# Patient Record
Sex: Male | Born: 1960 | Race: White | Hispanic: No | Marital: Married | Smoking: Never smoker
Health system: Southern US, Community
[De-identification: ages and names within clinical notes are randomized; demographics above are authoritative.]

## PROBLEM LIST (undated history)

## (undated) DIAGNOSIS — H409 Unspecified glaucoma: Secondary | ICD-10-CM

## (undated) DIAGNOSIS — E291 Testicular hypofunction: Secondary | ICD-10-CM

## (undated) DIAGNOSIS — E559 Vitamin D deficiency, unspecified: Secondary | ICD-10-CM

## (undated) DIAGNOSIS — I10 Essential (primary) hypertension: Secondary | ICD-10-CM

## (undated) DIAGNOSIS — E785 Hyperlipidemia, unspecified: Secondary | ICD-10-CM

## (undated) HISTORY — DX: Vitamin D deficiency, unspecified: E55.9

## (undated) HISTORY — PX: FINGER SURGERY: SHX640

## (undated) HISTORY — PX: WISDOM TOOTH EXTRACTION: SHX21

## (undated) HISTORY — DX: Testicular hypofunction: E29.1

## (undated) HISTORY — DX: Unspecified glaucoma: H40.9

---

## 2001-08-12 ENCOUNTER — Ambulatory Visit (HOSPITAL_COMMUNITY): Admission: RE | Admit: 2001-08-12 | Discharge: 2001-08-12 | Payer: Self-pay | Admitting: Internal Medicine

## 2001-08-12 ENCOUNTER — Encounter: Payer: Self-pay | Admitting: Internal Medicine

## 2001-08-15 ENCOUNTER — Emergency Department (HOSPITAL_COMMUNITY): Admission: EM | Admit: 2001-08-15 | Discharge: 2001-08-15 | Payer: Self-pay | Admitting: Emergency Medicine

## 2001-08-15 ENCOUNTER — Encounter: Payer: Self-pay | Admitting: Emergency Medicine

## 2004-10-07 ENCOUNTER — Emergency Department (HOSPITAL_COMMUNITY): Admission: EM | Admit: 2004-10-07 | Discharge: 2004-10-07 | Payer: Self-pay | Admitting: Emergency Medicine

## 2012-03-26 ENCOUNTER — Encounter (HOSPITAL_BASED_OUTPATIENT_CLINIC_OR_DEPARTMENT_OTHER): Payer: Self-pay | Admitting: *Deleted

## 2012-03-26 ENCOUNTER — Other Ambulatory Visit: Payer: Self-pay | Admitting: Orthopedic Surgery

## 2012-03-26 LAB — BASIC METABOLIC PANEL
BUN: 19 mg/dL (ref 6–23)
Calcium: 9.5 mg/dL (ref 8.4–10.5)
Chloride: 107 mEq/L (ref 96–112)
Creatinine, Ser: 1.03 mg/dL (ref 0.50–1.35)
GFR calc Af Amer: >90 mL/min (ref >90)

## 2012-03-26 NOTE — Progress Notes (Signed)
Called dr Oneta Rack for ekg and notes-bmet done

## 2012-03-26 NOTE — H&P (Signed)
  Jared Carr is an 51 y.o. male.   Chief Complaint: c/o injury to the left small finger. HPI: Patient is a 51 y/o male who sustained an injury to his left small finger when he was leading a horse on a rope and the rope became wrapped around his left small finger. This injury occurred on 03/13/12. He sought no medical treatment until when he presented to our office for evaluation.  Past Medical History  Diagnosis Date  . Hypertension   . Hyperlipemia     Past Surgical History  Procedure Date  . Wisdom tooth extraction     No family history on file. Social History:  reports that he has never smoked. He does not have any smokeless tobacco history on file. He reports that he drinks alcohol. His drug history not on file.  Allergies: No Known Allergies  No prescriptions prior to admission    No results found for this or any previous visit (from the past 48 hour(s)).  No results found.   Pertinent items are noted in HPI.  There were no vitals taken for this visit.  General appearance: alert Head: Normocephalic, without obvious abnormality Neck: supple, symmetrical, trachea midline Resp: clear to auscultation bilaterally Cardio: regular rate and rhythm GI: normal findings: bowel sounds normal Extremities:Exam of the left small finger reveals 2+ swelling with abnormal rotation. Neurovascularly intact.X-rays reveal long spiral oblique fracture of the P-1 segment with displacement. Pulses: 2+ and symmetric Skin: normal Neurologic: Grossly normal    Assessment/Plan Impression: Long spiral oblique fracture of the proximal phalanx left small finger.  Plan: Patient to be taken to the OR for ORIF of the left small finger P-1 segment. The procedure, risks and post-op course were discussed with the patient at length and he was in agreement with the plan.  DASNOIT,Ethelyne Erich J 03/26/2012, 4:50 PM    H&P documentation: 03/27/2012  -History and Physical Reviewed  -Patient has been  re-examined  -No change in the plan of care  Wyn Forster, MD

## 2012-03-27 ENCOUNTER — Encounter (HOSPITAL_BASED_OUTPATIENT_CLINIC_OR_DEPARTMENT_OTHER): Payer: Self-pay | Admitting: Anesthesiology

## 2012-03-27 ENCOUNTER — Encounter (HOSPITAL_BASED_OUTPATIENT_CLINIC_OR_DEPARTMENT_OTHER)
Admission: RE | Disposition: A | Discharge status: Home / Self Care | Payer: Self-pay | Source: Ambulatory Visit | Attending: Orthopedic Surgery

## 2012-03-27 ENCOUNTER — Encounter (HOSPITAL_BASED_OUTPATIENT_CLINIC_OR_DEPARTMENT_OTHER): Payer: Self-pay | Admitting: *Deleted

## 2012-03-27 ENCOUNTER — Ambulatory Visit (HOSPITAL_BASED_OUTPATIENT_CLINIC_OR_DEPARTMENT_OTHER): Payer: BC Managed Care – PPO | Admitting: Anesthesiology

## 2012-03-27 ENCOUNTER — Encounter (HOSPITAL_BASED_OUTPATIENT_CLINIC_OR_DEPARTMENT_OTHER): Payer: Self-pay | Admitting: Certified Registered"

## 2012-03-27 ENCOUNTER — Ambulatory Visit (HOSPITAL_BASED_OUTPATIENT_CLINIC_OR_DEPARTMENT_OTHER)
Admission: RE | Admit: 2012-03-27 | Discharge: 2012-03-27 | Disposition: A | Discharge status: Home / Self Care | Payer: BC Managed Care – PPO | Source: Ambulatory Visit | Attending: Orthopedic Surgery | Admitting: Orthopedic Surgery

## 2012-03-27 DIAGNOSIS — E785 Hyperlipidemia, unspecified: Secondary | ICD-10-CM | POA: Insufficient documentation

## 2012-03-27 DIAGNOSIS — I10 Essential (primary) hypertension: Secondary | ICD-10-CM | POA: Insufficient documentation

## 2012-03-27 DIAGNOSIS — IMO0002 Reserved for concepts with insufficient information to code with codable children: Secondary | ICD-10-CM | POA: Insufficient documentation

## 2012-03-27 DIAGNOSIS — Y9352 Activity, horseback riding: Secondary | ICD-10-CM | POA: Insufficient documentation

## 2012-03-27 DIAGNOSIS — X58XXXA Exposure to other specified factors, initial encounter: Secondary | ICD-10-CM | POA: Insufficient documentation

## 2012-03-27 HISTORY — DX: Essential (primary) hypertension: I10

## 2012-03-27 HISTORY — PX: ORIF FINGER FRACTURE: SHX2122

## 2012-03-27 HISTORY — DX: Hyperlipidemia, unspecified: E78.5

## 2012-03-27 SURGERY — OPEN REDUCTION INTERNAL FIXATION (ORIF) METACARPAL (FINGER) FRACTURE
Anesthesia: General | Site: Finger | Laterality: Left | Wound class: Clean

## 2012-03-27 MED ORDER — LIDOCAINE HCL 1 % IJ SOLN
INTRAMUSCULAR | Status: DC | PRN
  Administered 2012-03-27: 2 mL via INTRADERMAL

## 2012-03-27 MED ORDER — CHLORHEXIDINE GLUCONATE 4 % EX LIQD: 60.0000 mL | Freq: Once | CUTANEOUS | Status: DC

## 2012-03-27 MED ORDER — ACETAMINOPHEN 10 MG/ML IV SOLN: 1000.0000 mg | Freq: Once | INTRAVENOUS | Status: DC

## 2012-03-27 MED ORDER — ATROPINE SULFATE 0.4 MG/ML IJ SOLN: 0.4000 mg | Freq: Once | INTRAMUSCULAR | Status: DC | PRN

## 2012-03-27 MED ORDER — LIDOCAINE HCL (CARDIAC) 20 MG/ML IV SOLN
INTRAVENOUS | Status: DC | PRN
  Administered 2012-03-27: 40 mg via INTRAVENOUS

## 2012-03-27 MED ORDER — FENTANYL CITRATE 0.05 MG/ML IJ SOLN
50.0000 ug | INTRAMUSCULAR | Status: DC | PRN
  Administered 2012-03-27: 100 ug via INTRAVENOUS

## 2012-03-27 MED ORDER — FENTANYL CITRATE 0.05 MG/ML IJ SOLN
INTRAMUSCULAR | Status: DC | PRN
  Administered 2012-03-27: 25 ug via INTRAVENOUS

## 2012-03-27 MED ORDER — MIDAZOLAM HCL 2 MG/2ML IJ SOLN
1.0000 mg | INTRAMUSCULAR | Status: DC | PRN
  Administered 2012-03-27: 2 mg via INTRAVENOUS

## 2012-03-27 MED ORDER — CEFAZOLIN SODIUM-DEXTROSE 2-3 GM-% IV SOLR
2.0000 g | Freq: Once | INTRAVENOUS | Status: AC
  Administered 2012-03-27: 2 g via INTRAVENOUS

## 2012-03-27 MED ORDER — DEXAMETHASONE SODIUM PHOSPHATE 4 MG/ML IJ SOLN
INTRAMUSCULAR | Status: DC | PRN
  Administered 2012-03-27: 4 mg via INTRAVENOUS

## 2012-03-27 MED ORDER — OXYCODONE-ACETAMINOPHEN 5-325 MG PO TABS: ORAL_TABLET | ORAL | Status: DC

## 2012-03-27 MED ORDER — LACTATED RINGERS IV SOLN
INTRAVENOUS | Status: DC
  Administered 2012-03-27 (×2): via INTRAVENOUS

## 2012-03-27 MED ORDER — IBUPROFEN 600 MG PO TABS: 600.0000 mg | ORAL_TABLET | Freq: Four times a day (QID) | ORAL | Status: AC | PRN

## 2012-03-27 MED ORDER — CEPHALEXIN 500 MG PO CAPS: 500.0000 mg | ORAL_CAPSULE | Freq: Three times a day (TID) | ORAL | Status: AC

## 2012-03-27 MED ORDER — ONDANSETRON HCL 4 MG/2ML IJ SOLN
INTRAMUSCULAR | Status: DC | PRN
  Administered 2012-03-27: 4 mg via INTRAVENOUS

## 2012-03-27 MED ORDER — PROPOFOL 10 MG/ML IV EMUL
INTRAVENOUS | Status: DC | PRN
  Administered 2012-03-27: 250 mg via INTRAVENOUS

## 2012-03-27 MED ORDER — ROPIVACAINE HCL 5 MG/ML IJ SOLN
INTRAMUSCULAR | Status: DC | PRN
  Administered 2012-03-27: 25 mL

## 2012-03-27 SURGICAL SUPPLY — 68 items
BANDAGE ADHESIVE 1X3 (GAUZE/BANDAGES/DRESSINGS) IMPLANT
BANDAGE ELASTIC 3 VELCRO ST LF (GAUZE/BANDAGES/DRESSINGS) ×2 IMPLANT
BANDAGE GAUZE ELAST BULKY 4 IN (GAUZE/BANDAGES/DRESSINGS) IMPLANT
BIT DRILL 1.0 (BIT) ×4
BIT DRILL 1.0X50 (BIT) IMPLANT
BIT DRILL 1.1 MINI (BIT) IMPLANT
BLADE MINI RND TIP GREEN BEAV (BLADE) IMPLANT
BLADE SURG 15 STRL LF DISP TIS (BLADE) ×1 IMPLANT
BLADE SURG 15 STRL SS (BLADE) ×2
BNDG CMPR 9X4 STRL LF SNTH (GAUZE/BANDAGES/DRESSINGS) ×1
BNDG CMPR MD 5X2 ELC HKLP STRL (GAUZE/BANDAGES/DRESSINGS) ×1
BNDG COHESIVE 1X5 TAN STRL LF (GAUZE/BANDAGES/DRESSINGS) IMPLANT
BNDG ELASTIC 2 VLCR STRL LF (GAUZE/BANDAGES/DRESSINGS) ×1 IMPLANT
BNDG ESMARK 4X9 LF (GAUZE/BANDAGES/DRESSINGS) ×1 IMPLANT
BRUSH SCRUB EZ PLAIN DRY (MISCELLANEOUS) ×2 IMPLANT
CANISTER SUCTION 1200CC (MISCELLANEOUS) IMPLANT
CLOTH BEACON ORANGE TIMEOUT ST (SAFETY) ×2 IMPLANT
CORDS BIPOLAR (ELECTRODE) ×2 IMPLANT
COVER MAYO STAND STRL (DRAPES) ×2 IMPLANT
COVER TABLE BACK 60X90 (DRAPES) ×2 IMPLANT
CUFF TOURNIQUET SINGLE 18IN (TOURNIQUET CUFF) ×1 IMPLANT
DECANTER SPIKE VIAL GLASS SM (MISCELLANEOUS) IMPLANT
DRAPE OEC MINIVIEW 54X84 (DRAPES) ×2 IMPLANT
DRAPE SURG 17X23 STRL (DRAPES) ×2 IMPLANT
DRILL BIT 1.1 MINI (BIT) ×2
GLOVE BIOGEL M STRL SZ7.5 (GLOVE) ×3 IMPLANT
GLOVE BIOGEL PI IND STRL 8 (GLOVE) IMPLANT
GLOVE BIOGEL PI INDICATOR 8 (GLOVE) ×1
GLOVE ORTHO TXT STRL SZ7.5 (GLOVE) ×2 IMPLANT
GOWN PREVENTION PLUS XLARGE (GOWN DISPOSABLE) ×1 IMPLANT
GOWN PREVENTION PLUS XXLARGE (GOWN DISPOSABLE) ×1 IMPLANT
GOWN STRL REIN XL XLG (GOWN DISPOSABLE) ×4 IMPLANT
NEEDLE 27GAX1X1/2 (NEEDLE) IMPLANT
NS IRRIG 1000ML POUR BTL (IV SOLUTION) ×2 IMPLANT
PACK BASIN DAY SURGERY FS (CUSTOM PROCEDURE TRAY) ×2 IMPLANT
PAD CAST 3X4 CTTN HI CHSV (CAST SUPPLIES) ×1 IMPLANT
PAD CAST 4YDX4 CTTN HI CHSV (CAST SUPPLIES) IMPLANT
PADDING CAST ABS 4INX4YD NS (CAST SUPPLIES)
PADDING CAST ABS COTTON 4X4 ST (CAST SUPPLIES) ×1 IMPLANT
PADDING CAST COTTON 3X4 STRL (CAST SUPPLIES) ×2
PADDING CAST COTTON 4X4 STRL (CAST SUPPLIES)
PADDING UNDERCAST 2  STERILE (CAST SUPPLIES) IMPLANT
PLATE H EXTENDED 1.3 RIGHT (Plate) ×1 IMPLANT
SCREW SELF TAP CORTEX 1.3 6MM (Screw) ×2 IMPLANT
SCREW SELF TAP CORTEX 1.3 7MM (Screw) ×2 IMPLANT
SCREW SELF TAP CORTEX 1.3 8MM (Screw) ×3 IMPLANT
SCREW SELF TAP CORTEX 1.3 9MM (Screw) ×1 IMPLANT
SLEEVE SCD COMPRESS KNEE MED (MISCELLANEOUS) ×1 IMPLANT
SPLINT PLASTER CAST XFAST 3X15 (CAST SUPPLIES) ×1 IMPLANT
SPLINT PLASTER XTRA FASTSET 3X (CAST SUPPLIES) ×14
SPONGE GAUZE 4X4 12PLY (GAUZE/BANDAGES/DRESSINGS) ×2 IMPLANT
STOCKINETTE 4X48 STRL (DRAPES) ×2 IMPLANT
STRIP CLOSURE SKIN 1/2X4 (GAUZE/BANDAGES/DRESSINGS) ×1 IMPLANT
SUCTION FRAZIER TIP 10 FR DISP (SUCTIONS) IMPLANT
SUT ETHILON 4 0 PS 2 18 (SUTURE) ×1 IMPLANT
SUT MERSILENE 4 0 P 3 (SUTURE) ×2 IMPLANT
SUT PROLENE 3 0 PS 2 (SUTURE) ×2 IMPLANT
SUT PROLENE 4 0 P 3 18 (SUTURE) ×1 IMPLANT
SUT VIC AB 4-0 P-3 18XBRD (SUTURE) ×1 IMPLANT
SUT VIC AB 4-0 P3 18 (SUTURE) ×2
SYR 3ML 23GX1 SAFETY (SYRINGE) IMPLANT
SYR BULB 3OZ (MISCELLANEOUS) ×2 IMPLANT
SYR CONTROL 10ML LL (SYRINGE) IMPLANT
TOWEL OR 17X24 6PK STRL BLUE (TOWEL DISPOSABLE) ×2 IMPLANT
TRAY DSU PREP LF (CUSTOM PROCEDURE TRAY) ×2 IMPLANT
TUBE CONNECTING 20X1/4 (TUBING) IMPLANT
UNDERPAD 30X30 INCONTINENT (UNDERPADS AND DIAPERS) ×2 IMPLANT
WATER STERILE IRR 1000ML POUR (IV SOLUTION) IMPLANT

## 2012-03-27 NOTE — Anesthesia Preprocedure Evaluation (Signed)
Anesthesia Evaluation  Patient identified by MRN, date of birth, ID band Patient awake    Reviewed: Allergy & Precautions, H&P , NPO status , Patient's Chart, lab work & pertinent test results, reviewed documented beta blocker date and time   Airway Mallampati: II TM Distance: >3 FB Neck ROM: full    Dental   Pulmonary neg pulmonary ROS,          Cardiovascular negative cardio ROS      Neuro/Psych negative neurological ROS  negative psych ROS   GI/Hepatic negative GI ROS, Neg liver ROS,   Endo/Other  negative endocrine ROS  Renal/GU negative Renal ROS  negative genitourinary   Musculoskeletal   Abdominal   Peds  Hematology negative hematology ROS (+)   Anesthesia Other Findings See surgeon's H&P   Reproductive/Obstetrics negative OB ROS                           Anesthesia Physical Anesthesia Plan  ASA: I  Anesthesia Plan: General   Post-op Pain Management:    Induction: Intravenous  Airway Management Planned: LMA  Additional Equipment:   Intra-op Plan:   Post-operative Plan: Extubation in OR  Informed Consent: I have reviewed the patients History and Physical, chart, labs and discussed the procedure including the risks, benefits and alternatives for the proposed anesthesia with the patient or authorized representative who has indicated his/her understanding and acceptance.   Dental Advisory Given  Plan Discussed with: CRNA and Surgeon  Anesthesia Plan Comments:         Anesthesia Quick Evaluation  

## 2012-03-27 NOTE — Progress Notes (Signed)
EKG waived per Dr. Gelene Mink.

## 2012-03-27 NOTE — Anesthesia Procedure Notes (Addendum)
Anesthesia Regional Block:  Supraclavicular block  Pre-Anesthetic Checklist: ,, timeout performed, Correct Patient, Correct Site, Correct Laterality, Correct Procedure, Correct Position, site marked, Risks and benefits discussed,  Surgical consent,  Pre-op evaluation,  At surgeon's request and post-op pain management  Laterality: Left  Prep: chloraprep       Needles:   Needle Type: Other   (Arrow Echogenic)   Needle Length: 9cm  Needle Gauge: 21    Additional Needles:  Procedures: ultrasound guided Supraclavicular block Narrative:  Start time: 03/27/2012 1:45 PM End time: 03/27/2012 1:53 PM Injection made incrementally with aspirations every 5 mL.  Performed by: Personally  Anesthesiologist: C Frederick  Additional Notes: Ultrasound guidance used to: id relevant anatomy, confirm needle position, local anesthetic spread, avoidance of vascular puncture. Picture saved. No complications. Block performed personally by Janetta Hora. Gelene Mink, MD    Supraclavicular block Procedure Name: LMA Insertion Date/Time: 03/27/2012 3:55 PM Performed by: Verlan Friends Pre-anesthesia Checklist: Patient identified, Emergency Drugs available, Suction available, Patient being monitored and Timeout performed Patient Re-evaluated:Patient Re-evaluated prior to inductionOxygen Delivery Method: Circle System Utilized Preoxygenation: Pre-oxygenation with 100% oxygen Intubation Type: IV induction Ventilation: Mask ventilation without difficulty LMA: LMA inserted LMA Size: 5.0 Number of attempts: 1 Airway Equipment and Method: bite block Placement Confirmation: positive ETCO2 Tube secured with: Tape Dental Injury: Teeth and Oropharynx as per pre-operative assessment

## 2012-03-27 NOTE — Brief Op Note (Signed)
03/27/2012  4:55 PM  PATIENT:  Kenard Heidi Dach  51 y.o. 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  PROCEDURE:  TAKE DOWN AND DELAYED LENGTHENING OSTEOPLASTY OF LEFT SMALL PROXIMAL PHALANX WITH PLACEMENT OF LADDER PLATE AND EIGHT SCREWS  SURGEON:   Wyn Forster., MD   PHYSICIAN ASSISTANT:   ASSISTANTS: Mallory Shirk.A-C   ANESTHESIA:   general  EBL:  Total I/O In: 1800 [I.V.:1800] Out: -   BLOOD ADMINISTERED:none  DRAINS: none   LOCAL MEDICATIONS USED: ROPIVICAINE ARM BLOCK  SPECIMEN:  No Specimen  DISPOSITION OF SPECIMEN:  N/A  COUNTS:  YES  TOURNIQUET:  * Missing tourniquet times found for documented tourniquets in log:  [id number] *  DICTATION: .Other Dictation: Dictation Number [id number]  PLAN OF CARE: Discharge to home after PACU  PATIENT DISPOSITION:  PACU - hemodynamically stable.

## 2012-03-27 NOTE — Progress Notes (Signed)
Assisted Dr. Frederick with left, ultrasound guided, supraclavicular block. Side rails up, monitors on throughout procedure. See vital signs in flow sheet. Tolerated Procedure well. 

## 2012-03-27 NOTE — Anesthesia Postprocedure Evaluation (Signed)
  Anesthesia Post-op Note  Patient: Jared Carr  Procedure(s) Performed: Procedure(s) (LRB): OPEN REDUCTION INTERNAL FIXATION (ORIF) METACARPAL (FINGER) FRACTURE (Left)  Patient Location: PACU  Anesthesia Type: GA combined with regional for post-op pain  Level of Consciousness: awake, alert  and oriented  Airway and Oxygen Therapy: Patient Spontanous Breathing  Post-op Pain: none  Post-op Assessment: Post-op Vital signs reviewed  Post-op Vital Signs: Reviewed  Complications: No apparent anesthesia complications

## 2012-03-27 NOTE — Transfer of Care (Signed)
Immediate Anesthesia Transfer of Care Note  Patient: Jared Carr  Procedure(s) Performed: Procedure(s) (LRB): OPEN REDUCTION INTERNAL FIXATION (ORIF) METACARPAL (FINGER) FRACTURE (Left)  Patient Location: PACU  Anesthesia Type: GA combined with regional for post-op pain  Level of Consciousness: awake, alert , oriented and patient cooperative  Airway & Oxygen Therapy: Patient Spontanous Breathing and Patient connected to face mask oxygen  Post-op Assessment: Report given to PACU RN and Post -op Vital signs reviewed and stable  Post vital signs: Reviewed and stable  Complications: No apparent anesthesia complications

## 2012-03-27 NOTE — Op Note (Signed)
[id number] 

## 2012-03-28 ENCOUNTER — Encounter (HOSPITAL_BASED_OUTPATIENT_CLINIC_OR_DEPARTMENT_OTHER): Payer: Self-pay | Admitting: Orthopedic Surgery

## 2012-03-28 NOTE — Op Note (Signed)
NAMECAMEREN, EARNEST NO.:  [id number]  MEDICAL RECORD NO.:  [id number]  LOCATION:                                 FACILITY:  PHYSICIAN:  Katy Fitch. Seaborn Nakama, M.D.      DATE OF BIRTH:  DATE OF PROCEDURE:  03/27/2012 DATE OF DISCHARGE:                              OPERATIVE REPORT   PREOPERATIVE DIAGNOSES:  Fourteen days status post severely comminuted, angulated and shortened fracture of left small finger proximal phalanx with early malunion and considerable callus formation.  POSTOPERATIVE DIAGNOSES:  Fourteen days status post severely comminuted, angulated and shortened fracture of left small finger proximal phalanx with early malunion and considerable callus formation.  OPERATION:  Lengthening osteoplasty with removal of callus and reconstruction of anatomic left small finger proximal phalanx fracture with application of a ladder plate with eight 1.3-mm screws.  OPERATING SURGEON:  Katy Fitch. Courtlynn Holloman, MD  ASSISTANT:  Marveen Reeks Dasnoit, PA-C  ANESTHESIA:  General by LMA supplemented by a left ropivacaine brachial block.  SUPERVISING ANESTHESIOLOGIST:  Janetta Hora. Gelene Mink, MD  INDICATIONS:  Rube Palardy is a 51 year old gentleman referred through the courtesy of Dr. Lucky Cowboy for a chronic painful and deformed left small finger following a significant injury 14 days prior.  He was walking a horse with the lead and had the lead wrapped around his fingers.  The horse bolted and twisted his small finger aggressively. He had a significant pain, swelling, and ecchymosis.  He was in [place] and did not immediately seek healthcare.  He had difficulty with shortening of the finger, extensor lag and inability to flex the finger and noted a rotational deformity.  He was referred by his primary care physician, Dr. Oneta Rack for hand surgery consult on March 26, 2012.  At that time, he was noted to have a malrotated and shortened finger with a significant  extensor lag at the PIP joint and could not flex his finger to within 3 cm of the distal palmar crease.  Four views of the finger demonstrated a comminuted spiral oblique fracture with shortening and apex volar angulation.  We had a detailed informed consent describing all the reasons why anatomic reduction of the fracture is in his best interest.  If the bone remains angulated and shortened, he will always have extensor lag.  He will have tenodesis and inability to flex the finger.  He understands with the late reduction and plate fixation.  He may have tendon adhesion that will cause impaired motion; however, we could perform a late tenolysis and plate removal and should be able to gain significantly improved function with an anatomic reduction of his fracture.  After informed consent, he is brought to the operating room at this time.  PROCEDURE:  Deangelo Groene was brought to room #6 of the [facility] and placed in supine position on the operating table.  Mr. Dimmick experienced a more than 4-hour wait.  Due to multiple difficulties, our surgical team experienced throughout the day due to the Epic electronic medical record.  Indeed, his immediate surgery was delayed for 1 hour and 30 minutes while we attempted to complete the order set for our prior  patient who had have overnight stay.  This is being documented in that if Mr. Southwood is very frustrated at this time with his prolonged wait, but this truly was due to circumstances beyond the control of the surgical team and absolutely related to the Epic medical record system.  Dr. Gelene Mink of Anesthesia provided detailed informed consent.  A perioperative block for comfort was recommended and accepted by Mr. Cope.  Dr. Gelene Mink placed a ropivacaine block without complication leading to excellent anesthesia of the left arm.  Mr. Glazier was then transferred to room #6 where under Dr. Thornton Dales direct supervision,  general anesthesia by LMA technique was induced and 2 g of Ancef were administered as an IV prophylactic antibiotic.  The left arm was prepped with Betadine soap and solution, and sterilely draped.  A sterile C-arm fluoroscope was brought in and this fracture was studied.  With rotational and angular force, the fracture did not move.  Therefore, this was an incipient malunion.  Following routine Betadine scrub and paint of the left upper extremity, we exsanguinated the arm with an Esmarch bandage and inflated an arterial tourniquet of the proximal brachium to 220 mmHg.  Following routine surgical time-out, the procedure commenced with a dorsal incision that curved around the MP extension creases.  The soft tissues were cleared from the dorsum of the extensor mechanism and the extensor was split longitudinally in its midline.  The periosteum had thickened from 1 mm to approximately 4.5 mm with quite a bit of early callus.  We meticulously debrided all the callus preserving as much superficial periosteum as possible.  The fracture involved at least 6 fragments and was quite short and angulated apex volar.  We disassembled the fracture fragments with a dental pick.  Reassembled them as a jigsaw puzzle anatomically and custom bent eight screw 1.3-mm Synthes ladder plate and applied it to the dorsum of the proximal phalanx.  We sequentially placed screws with C-arm fluoroscopic control until a stable construct was achieved.  AP, lateral, and oblique images revealed that the screws were in appropriate length except for perhaps one distal screw that was perhaps 1.5-mm proud.  Unfortunately, we used the shorter screws available in the Synthes set.  The wound was then repaired with a running suture of 4-0 Vicryl, repairing the periosteum as anatomic as possible over the plate.  The extensor was repaired with figure-of-eight sutures of 4-0 Mersilene. The skin was repaired with intradermal 3-0  Prolene and Steri-Strips.  Mr. Caudill was placed in compressive dressing with the ring and small fingers in the safe position.  There were no apparent complications.  We will see him back for follow up in 4 days to initiate active range of motion exercises, carefully buddy strapped to the adjacent ring finger.  For aftercare, he is provided prescriptions for Percocet 5/325 1 p.o. q.4-6 hours p.r.n. pain, 30 tablets without refill; also Motrin 600 mg 1 p.o. q.6 hours p.r.n. pain, 30 tablets with 1 refill; and Keflex 500 mg 1 p.o. q.8 hours x4 days as a prophylactic antibiotic.     Katy Fitch Holleigh Crihfield, M.D.     RVS/MEDQ  D:  03/27/2012  T:  03/28/2012  Job:  [id number]

## 2013-10-04 ENCOUNTER — Encounter: Payer: Self-pay | Admitting: *Deleted

## 2013-10-04 DIAGNOSIS — E559 Vitamin D deficiency, unspecified: Secondary | ICD-10-CM | POA: Insufficient documentation

## 2013-10-04 DIAGNOSIS — E291 Testicular hypofunction: Secondary | ICD-10-CM | POA: Insufficient documentation

## 2013-10-04 DIAGNOSIS — I10 Essential (primary) hypertension: Secondary | ICD-10-CM | POA: Insufficient documentation

## 2013-10-06 ENCOUNTER — Ambulatory Visit: Payer: Self-pay | Admitting: Emergency Medicine

## 2013-10-26 ENCOUNTER — Other Ambulatory Visit: Payer: Self-pay | Admitting: Internal Medicine

## 2014-01-05 ENCOUNTER — Encounter: Payer: Self-pay | Admitting: Internal Medicine

## 2014-01-05 ENCOUNTER — Ambulatory Visit (INDEPENDENT_AMBULATORY_CARE_PROVIDER_SITE_OTHER): Payer: BC Managed Care – PPO | Admitting: Internal Medicine

## 2014-01-05 VITALS — BP 132/84 | HR 60 | Temp 98.8°F | Resp 16 | Ht 69.5 in | Wt 200.0 lb

## 2014-01-05 DIAGNOSIS — E291 Testicular hypofunction: Secondary | ICD-10-CM

## 2014-01-05 DIAGNOSIS — Z79899 Other long term (current) drug therapy: Secondary | ICD-10-CM

## 2014-01-05 DIAGNOSIS — R7309 Other abnormal glucose: Secondary | ICD-10-CM

## 2014-01-05 DIAGNOSIS — E559 Vitamin D deficiency, unspecified: Secondary | ICD-10-CM

## 2014-01-05 DIAGNOSIS — E782 Mixed hyperlipidemia: Secondary | ICD-10-CM | POA: Insufficient documentation

## 2014-01-05 DIAGNOSIS — I10 Essential (primary) hypertension: Secondary | ICD-10-CM

## 2014-01-05 LAB — CBC WITH DIFFERENTIAL/PLATELET
BASOS ABS: 0.1 10*3/uL (ref 0.0–0.1)
Basophils Relative: 1 % (ref 0–1)
Eosinophils Absolute: 0.3 10*3/uL (ref 0.0–0.7)
Eosinophils Relative: 4 % (ref 0–5)
HEMATOCRIT: 41.1 % (ref 39.0–52.0)
HEMOGLOBIN: 14.2 g/dL (ref 13.0–17.0)
LYMPHS PCT: 27 % (ref 12–46)
Lymphs Abs: 2.2 10*3/uL (ref 0.7–4.0)
MCH: 31 pg (ref 26.0–34.0)
MCHC: 34.5 g/dL (ref 30.0–36.0)
MCV: 89.7 fL (ref 78.0–100.0)
MONO ABS: 0.8 10*3/uL (ref 0.1–1.0)
Monocytes Relative: 10 % (ref 3–12)
NEUTROS ABS: 4.7 10*3/uL (ref 1.7–7.7)
Neutrophils Relative %: 58 % (ref 43–77)
Platelets: 289 10*3/uL (ref 150–400)
RBC: 4.58 MIL/uL (ref 4.22–5.81)
RDW: 13.4 % (ref 11.5–15.5)
WBC: 8.1 10*3/uL (ref 4.0–10.5)

## 2014-01-05 NOTE — Patient Instructions (Signed)

## 2014-01-05 NOTE — Progress Notes (Signed)
Patient ID: Jared Carr, male   DOB: [birth year], 53 y.o.   MRN: [id number]    This very nice 53 y.o. male presents for 3 month follow up with Hypertension, Hyperlipidemia, Pre-Diabetes and Vitamin D Deficiency.    HTN predates since   . BP has been controlled at home. Today's BP: 132/84 mmHg . Patient denies any cardiac type chest pain, palpitations, dyspnea/orthopnea/PND, dizziness, claudication, or dependent edema.   Hyperlipidemia is controlled with diet & meds. Last Cholesterol was 149, Triglycerides were 64, HDL 50 and LDL 86 in Oct 2014 - at goal. Patient denies myalgias or other med SE's.    Also, the patient has history of PreDiabetes since 2012 w/A1c of 5.7% and  with last A1c of  6.1% in Oct 2014. Patient denies any symptoms of reactive hypoglycemia, diabetic polys, paresthesias or visual blurring.   Further, Patient has history of Vitamin D Deficiency of 24 in 2008 with last vitamin D of 78 in Oct 2014. Patient supplements vitamin D without any suspected side-effects.  Medication Sig  . aspirin 81 MG tablet Take 81 mg by mouth daily.  Marland Kitchen atenolol (TENORMIN) 100 MG tablet Take 100 mg by mouth at bedtime.  . Cholecalciferol (VITAMIN D3) 5000 UNITS  Take by mouth daily.  . fenofibrate 160 MG tablet Take 160 mg by mouth at bedtime.  Marland Kitchen oxyCODONE-acetaminophen (PERCOCET) 5-325 MG per tablet 1 or 2 tabs every 4 hours as needed for pain  . testosterone cypionate (DEPOTESTOTERONE CYPIONATE) 200  INJECT 1 & 1/2 MILLILITERS EVERY 4 WEEKS    Allergies  Allergen Reactions  . Viagra [Sildenafil Citrate]     palpitations   PMHx:   Past Medical History  Diagnosis Date  . Hyperlipemia   . Hypertension   . Hypogonadism male   . Vitamin D deficiency    FHx:    Reviewed / unchanged  SHx:    Reviewed / unchanged   Systems Review: Constitutional: Denies fever, chills, wt changes, headaches, insomnia, fatigue, night sweats, change in appetite. Eyes: Denies redness, blurred vision,  diplopia, discharge, itchy, watery eyes.  ENT: Denies discharge, congestion, post nasal drip, epistaxis, sore throat, earache, hearing loss, dental pain, tinnitus, vertigo, sinus pain, snoring.  CV: Denies chest pain, palpitations, irregular heartbeat, syncope, dyspnea, diaphoresis, orthopnea, PND, claudication, edema. Respiratory: denies cough, dyspnea, DOE, pleurisy, hoarseness, laryngitis, wheezing.  Gastrointestinal: Denies dysphagia, odynophagia, heartburn, reflux, water brash, abdominal pain or cramps, nausea, vomiting, bloating, diarrhea, constipation, hematemesis, melena, hematochezia,  or hemorrhoids. Genitourinary: Denies dysuria, frequency, urgency, nocturia, hesitancy, discharge, hematuria, flank pain. Musculoskeletal: Denies arthralgias, myalgias, stiffness, jt. swelling, pain, limp, strain/sprain.  Skin: Denies pruritus, rash, hives, warts, acne, eczema, change in skin lesion(s). Neuro: No weakness, tremor, incoordination, spasms, paresthesia, or pain. Psychiatric: Denies confusion, memory loss, or sensory loss. Endo: Denies change in weight, skin, hair change.  Heme/Lymph: No excessive bleeding, bruising, orenlarged lymph nodes.   Exam:  BP 132/84  Pulse 60  Temp(Src) 98.8 F (37.1 C) (Temporal)  Resp 16  Ht 5' 9.5" (1.765 m)  Wt 200 lb (90.719 kg)  BMI 29.12 kg/m2  Appears well nourished - in no distress. Eyes: PERRLA, EOMs, conjunctiva no swelling or erythema. Sinuses: No frontal/maxillary tenderness ENT/Mouth: EAC's clear, TM's nl w/o erythema, bulging. Nares clear w/o erythema, swelling, exudates. Oropharynx clear without erythema or exudates. Oral hygiene is good. Tongue normal, non obstructing. Hearing intact.  Neck: Supple. Thyroid nl. Car 2+/2+ without bruits, nodes or JVD. Chest: Respirations nl with BS clear &  equal w/o rales, rhonchi, wheezing or stridor.  Cor: Heart sounds normal w/ regular rate and rhythm without sig. murmurs, gallops, clicks, or rubs.  Peripheral pulses normal and equal  without edema.  Abdomen: Soft & bowel sounds normal. Non-tender w/o guarding, rebound, hernias, masses, or organomegaly.  Lymphatics: Unremarkable.  Musculoskeletal: Full ROM all peripheral extremities, joint stability, 5/5 strength, and normal gait.  Skin: Warm, dry without exposed rashes, lesions, ecchymosis apparent.  Neuro: Cranial nerves intact, reflexes equal bilaterally. Sensory-motor testing grossly intact. Tendon reflexes grossly intact.  Pysch: Alert & oriented x 3. Insight and judgement nl & appropriate. No ideations.  Assessment and Plan:  1. Hypertension - Continue monitor blood pressure at home. Continue diet/meds same.  2. Hyperlipidemia - Continue diet/meds, exercise,& lifestyle modifications. Continue monitor periodic cholesterol/liver & renal functions   3. Pre-diabetes - Continue diet, exercise, lifestyle modifications. Monitor appropriate labs.  4. Vitamin D Deficiency - Continue supplementation.  Recommended regular exercise, BP monitoring, weight control, and discussed med and SE's. Recommended labs to assess and monitor clinical status. Further disposition pending results of labs.

## 2014-01-06 LAB — LIPID PANEL
CHOL/HDL RATIO: 3.5 ratio
Cholesterol: 173 mg/dL (ref 0–200)
HDL: 49 mg/dL (ref >39)
LDL CALC: 105 mg/dL — AB (ref 0–99)
TRIGLYCERIDES: 95 mg/dL (ref <150)
VLDL: 19 mg/dL (ref 0–40)

## 2014-01-06 LAB — HEMOGLOBIN A1C
Hgb A1c MFr Bld: 6 % — ABNORMAL HIGH (ref <5.7)
Mean Plasma Glucose: 126 mg/dL — ABNORMAL HIGH (ref <117)

## 2014-01-06 LAB — BASIC METABOLIC PANEL WITH GFR
BUN: 23 mg/dL (ref 6–23)
CHLORIDE: 104 meq/L (ref 96–112)
CO2: 27 mEq/L (ref 19–32)
Calcium: 9.4 mg/dL (ref 8.4–10.5)
Creat: 0.9 mg/dL (ref 0.50–1.35)
GFR, Est African American: >89 mL/min
Glucose, Bld: 73 mg/dL (ref 70–99)
POTASSIUM: 4.1 meq/L (ref 3.5–5.3)
Sodium: 140 mEq/L (ref 135–145)

## 2014-01-06 LAB — INSULIN, FASTING: Insulin fasting, serum: 11 u[IU]/mL (ref 3–28)

## 2014-01-06 LAB — HEPATIC FUNCTION PANEL
ALK PHOS: 35 U/L — AB (ref 39–117)
ALT: 23 U/L (ref 0–53)
AST: 19 U/L (ref 0–37)
Albumin: 4.2 g/dL (ref 3.5–5.2)
BILIRUBIN INDIRECT: 0.4 mg/dL (ref 0.2–1.2)
BILIRUBIN TOTAL: 0.5 mg/dL (ref 0.2–1.2)
Bilirubin, Direct: 0.1 mg/dL (ref 0.0–0.3)
TOTAL PROTEIN: 6.8 g/dL (ref 6.0–8.3)

## 2014-01-06 LAB — TSH: TSH: 2.55 u[IU]/mL (ref 0.350–4.500)

## 2014-01-06 LAB — MAGNESIUM: Magnesium: 1.9 mg/dL (ref 1.5–2.5)

## 2014-01-06 LAB — TESTOSTERONE: Testosterone: 781 ng/dL (ref 300–890)

## 2014-01-06 LAB — VITAMIN D 25 HYDROXY (VIT D DEFICIENCY, FRACTURES): Vit D, 25-Hydroxy: 76 ng/mL (ref 30–89)

## 2014-01-08 ENCOUNTER — Other Ambulatory Visit: Payer: Self-pay | Admitting: Emergency Medicine

## 2014-02-01 ENCOUNTER — Other Ambulatory Visit: Payer: Self-pay | Admitting: Internal Medicine

## 2014-04-07 ENCOUNTER — Ambulatory Visit: Payer: Self-pay | Admitting: Emergency Medicine

## 2014-04-19 ENCOUNTER — Encounter: Payer: Self-pay | Admitting: Internal Medicine

## 2014-04-19 ENCOUNTER — Ambulatory Visit (INDEPENDENT_AMBULATORY_CARE_PROVIDER_SITE_OTHER): Payer: BC Managed Care – PPO | Admitting: Internal Medicine

## 2014-04-19 VITALS — BP 118/62 | HR 64 | Temp 99.1°F | Resp 16 | Ht 69.5 in | Wt 198.8 lb

## 2014-04-19 DIAGNOSIS — Z79899 Other long term (current) drug therapy: Secondary | ICD-10-CM

## 2014-04-19 DIAGNOSIS — I10 Essential (primary) hypertension: Secondary | ICD-10-CM

## 2014-04-19 DIAGNOSIS — K2100 Gastro-esophageal reflux disease with esophagitis, without bleeding: Secondary | ICD-10-CM

## 2014-04-19 DIAGNOSIS — E291 Testicular hypofunction: Secondary | ICD-10-CM

## 2014-04-19 DIAGNOSIS — E782 Mixed hyperlipidemia: Secondary | ICD-10-CM

## 2014-04-19 DIAGNOSIS — E559 Vitamin D deficiency, unspecified: Secondary | ICD-10-CM

## 2014-04-19 DIAGNOSIS — R7309 Other abnormal glucose: Secondary | ICD-10-CM

## 2014-04-19 LAB — CBC WITH DIFFERENTIAL/PLATELET
BASOS ABS: 0.1 10*3/uL (ref 0.0–0.1)
Basophils Relative: 1 % (ref 0–1)
Eosinophils Absolute: 0.2 10*3/uL (ref 0.0–0.7)
Eosinophils Relative: 3 % (ref 0–5)
HEMATOCRIT: 42.1 % (ref 39.0–52.0)
HEMOGLOBIN: 14.2 g/dL (ref 13.0–17.0)
LYMPHS PCT: 31 % (ref 12–46)
Lymphs Abs: 2.3 10*3/uL (ref 0.7–4.0)
MCH: 30.7 pg (ref 26.0–34.0)
MCHC: 33.7 g/dL (ref 30.0–36.0)
MCV: 91.1 fL (ref 78.0–100.0)
MONO ABS: 0.8 10*3/uL (ref 0.1–1.0)
MONOS PCT: 10 % (ref 3–12)
NEUTROS ABS: 4.1 10*3/uL (ref 1.7–7.7)
Neutrophils Relative %: 55 % (ref 43–77)
Platelets: 305 10*3/uL (ref 150–400)
RBC: 4.62 MIL/uL (ref 4.22–5.81)
RDW: 13.2 % (ref 11.5–15.5)
WBC: 7.5 10*3/uL (ref 4.0–10.5)

## 2014-04-19 NOTE — Progress Notes (Signed)
Patient ID: Jared Carr, male   DOB: 12-06-1960, 53 y.o.   MRN: [id number]   This very nice 53 y.o.male presents for 3 month follow up with Hypertension, Hyperlipidemia, Pre-Diabetes, testosterone  and Vitamin D Deficiency. Patient relates sense of stamina and improved mood since on Testosterone replacement.   Patient is treated for HTN & BP has been controlled at home. Today's BP: 118/62 mmHg. Patient denies any cardiac type chest pain, palpitations, dyspnea/orthopnea/PND, dizziness, claudication, or dependent edema.   Hyperlipidemia is controlled with diet & meds. Patient denies myalgias or other med SE's. Last Lipids were 01/05/2014: Cholesterol, Total 173; HDL Cholesterol by NMR 49; LDL (calc) 105*; Triglycerides 95   Also, the patient has history of PreDiabetes (A1c 6.0% in Mar 2012) and patient denies any symptoms of reactive hypoglycemia, diabetic polys, paresthesias or visual blurring.  Last A1c was 01/05/2014: Hemoglobin-A1c 6.0*    Further, Patient has history of Vitamin D Deficiency (24 in 2008) and patient supplements vitamin D without any suspected side-effects. Last vitamin D was    Medication List   Take 81 mg by mouth daily.     atenolol 100 MG tablet  TAKE 1 TABLET BY MOUTH DAILY FOR BLOOD PRESSURE     fenofibrate micronized 134 MG capsule  TAKE 1 CAPSULE BY MOUTH AT BEDTIME     Depo-testosterone cypionate 200 MG/ML injection  INJECT 1 AND 1/2 MILLILITERS EVERY 4 WEEKS     Vitamin D3 5000 UNITS Caps  Take by mouth daily.     Allergies  Allergen Reactions  . Viagra [Sildenafil Citrate]     palpitations   PMHx:   Past Medical History  Diagnosis Date  . Hyperlipemia   . Hypertension   . Hypogonadism male   . Vitamin D deficiency    FHx:    Reviewed / unchanged SHx:    Reviewed / unchanged  Systems Review:  Constitutional: Denies fever, chills, wt changes, headaches, insomnia, fatigue, night sweats, change in appetite. Eyes: Denies redness, blurred vision,  diplopia, discharge, itchy, watery eyes.  ENT: Denies discharge, congestion, post nasal drip, epistaxis, sore throat, earache, hearing loss, dental pain, tinnitus, vertigo, sinus pain, snoring.  CV: Denies chest pain, palpitations, irregular heartbeat, syncope, dyspnea, diaphoresis, orthopnea, PND, claudication or edema. Respiratory: denies cough, dyspnea, DOE, pleurisy, hoarseness, laryngitis, wheezing.  Gastrointestinal: Denies dysphagia, odynophagia, heartburn, reflux, water brash, abdominal pain or cramps, nausea, vomiting, bloating, diarrhea, constipation, hematemesis, melena, hematochezia  or hemorrhoids. Genitourinary: Denies dysuria, frequency, urgency, nocturia, hesitancy, discharge, hematuria or flank pain. Musculoskeletal: Denies arthralgias, myalgias, stiffness, jt. swelling, pain, limping or strain/sprain.  Skin: Denies pruritus, rash, hives, warts, acne, eczema or change in skin lesion(s). Neuro: No weakness, tremor, incoordination, spasms, paresthesia or pain. Psychiatric: Denies confusion, memory loss or sensory loss. Endo: Denies change in weight, skin or hair change.  Heme/Lymph: No excessive bleeding, bruising or enlarged lymph nodes.  Exam:  BP 118/62  Pulse 64  Temp(Src) 99.1 F (37.3 C) (Temporal)  Resp 16  Ht 5' 9.5" (1.765 m)  Wt 198 lb 12.8 oz (90.175 kg)  BMI 28.95 kg/m2  Appears well nourished and in no distress. Eyes: PERRLA, EOMs, conjunctiva no swelling or erythema. Sinuses: No frontal/maxillary tenderness ENT/Mouth: EAC's clear, TM's nl w/o erythema, bulging. Nares clear w/o erythema, swelling, exudates. Oropharynx clear without erythema or exudates. Oral hygiene is good. Tongue normal, non obstructing. Hearing intact.  Neck: Supple. Thyroid nl. Car 2+/2+ without bruits, nodes or JVD. Chest: Respirations nl with BS clear & equal  w/o rales, rhonchi, wheezing or stridor.  Cor: Heart sounds normal w/ regular rate and rhythm without sig. murmurs, gallops,  clicks, or rubs. Peripheral pulses normal and equal  without edema.  Abdomen: Soft & bowel sounds normal. Non-tender w/o guarding, rebound, hernias, masses, or organomegaly.  Lymphatics: Unremarkable.  Musculoskeletal: Full ROM all peripheral extremities, joint stability, 5/5 strength, and normal gait.  Skin: Warm, dry without exposed rashes, lesions or ecchymosis apparent.  Neuro: Cranial nerves intact, reflexes equal bilaterally. Sensory-motor testing grossly intact. Tendon reflexes grossly intact.  Pysch: Alert & oriented x 3. Insight and judgement nl & appropriate. No ideations.  Assessment and Plan:  1. Hypertension - Continue monitor blood pressure at home. Continue diet/meds same.  2. Hyperlipidemia - Continue diet/meds, exercise,& lifestyle modifications. Continue monitor periodic cholesterol/liver & renal functions   3. Pre-Diabetes - Continue diet, exercise, lifestyle modifications. Monitor appropriate labs.  4. Vitamin D Deficiency - Continue supplementation.  5. Testosterone Deficiency- continue replacement  Recommended regular exercise, BP monitoring, weight control, and discussed med and SE's. Recommended labs to assess and monitor clinical status. Further disposition pending results of labs.

## 2014-04-19 NOTE — Patient Instructions (Signed)

## 2014-04-20 LAB — BASIC METABOLIC PANEL WITH GFR
BUN: 19 mg/dL (ref 6–23)
CHLORIDE: 108 meq/L (ref 96–112)
CO2: 25 mEq/L (ref 19–32)
Calcium: 10.2 mg/dL (ref 8.4–10.5)
Creat: 0.99 mg/dL (ref 0.50–1.35)
GFR, EST NON AFRICAN AMERICAN: 86 mL/min
GFR, Est African American: >89 mL/min
GLUCOSE: 85 mg/dL (ref 70–99)
POTASSIUM: 4.5 meq/L (ref 3.5–5.3)
SODIUM: 144 meq/L (ref 135–145)

## 2014-04-20 LAB — LIPID PANEL
Cholesterol: 157 mg/dL (ref 0–200)
HDL: 55 mg/dL (ref >39)
LDL Cholesterol: 87 mg/dL (ref 0–99)
Total CHOL/HDL Ratio: 2.9 Ratio
Triglycerides: 77 mg/dL (ref <150)
VLDL: 15 mg/dL (ref 0–40)

## 2014-04-20 LAB — HEPATIC FUNCTION PANEL
ALK PHOS: 33 U/L — AB (ref 39–117)
ALT: 20 U/L (ref 0–53)
AST: 19 U/L (ref 0–37)
Albumin: 4.4 g/dL (ref 3.5–5.2)
BILIRUBIN DIRECT: 0.1 mg/dL (ref 0.0–0.3)
BILIRUBIN INDIRECT: 0.4 mg/dL (ref 0.2–1.2)
TOTAL PROTEIN: 6.6 g/dL (ref 6.0–8.3)
Total Bilirubin: 0.5 mg/dL (ref 0.2–1.2)

## 2014-04-20 LAB — MAGNESIUM: Magnesium: 1.9 mg/dL (ref 1.5–2.5)

## 2014-04-20 LAB — TESTOSTERONE: TESTOSTERONE: 882 ng/dL (ref 300–890)

## 2014-04-20 LAB — VITAMIN D 25 HYDROXY (VIT D DEFICIENCY, FRACTURES): Vit D, 25-Hydroxy: 98 ng/mL — ABNORMAL HIGH (ref 30–89)

## 2014-04-20 LAB — HEMOGLOBIN A1C
HEMOGLOBIN A1C: 5.9 % — AB (ref <5.7)
MEAN PLASMA GLUCOSE: 123 mg/dL — AB (ref <117)

## 2014-04-20 LAB — INSULIN, FASTING: Insulin fasting, serum: 11 u[IU]/mL (ref 3–28)

## 2014-04-20 LAB — TSH: TSH: 2.176 u[IU]/mL (ref 0.350–4.500)

## 2014-04-22 LAB — HELICOBACTER PYLORI ABS-IGG+IGA, BLD
H Pylori IgG: 7.95 {ISR} — ABNORMAL HIGH
HELICOBACTER PYLORI AB, IGA: 12.1 U/mL — ABNORMAL HIGH (ref <9.0)

## 2014-04-24 ENCOUNTER — Other Ambulatory Visit: Payer: Self-pay | Admitting: Internal Medicine

## 2014-04-24 DIAGNOSIS — A048 Other specified bacterial intestinal infections: Secondary | ICD-10-CM

## 2014-04-24 MED ORDER — AMOXICILLIN 500 MG PO TABS: ORAL_TABLET | ORAL | Status: DC

## 2014-04-24 MED ORDER — CLARITHROMYCIN 500 MG PO TABS
ORAL_TABLET | ORAL | Status: AC
Start: 2014-04-24 — End: 2014-05-25

## 2014-04-24 MED ORDER — OMEPRAZOLE 20 MG PO CPDR: DELAYED_RELEASE_CAPSULE | ORAL | Status: DC

## 2014-06-24 ENCOUNTER — Encounter: Payer: Self-pay | Admitting: Internal Medicine

## 2014-07-12 ENCOUNTER — Other Ambulatory Visit: Payer: Self-pay | Admitting: Internal Medicine

## 2014-07-12 DIAGNOSIS — E785 Hyperlipidemia, unspecified: Secondary | ICD-10-CM

## 2014-08-02 ENCOUNTER — Ambulatory Visit (INDEPENDENT_AMBULATORY_CARE_PROVIDER_SITE_OTHER): Payer: BC Managed Care – PPO | Admitting: Internal Medicine

## 2014-08-02 ENCOUNTER — Other Ambulatory Visit: Payer: Self-pay | Admitting: Internal Medicine

## 2014-08-02 ENCOUNTER — Encounter: Payer: Self-pay | Admitting: Internal Medicine

## 2014-08-02 VITALS — BP 128/80 | HR 60 | Temp 98.6°F | Resp 16 | Ht 69.5 in | Wt 199.0 lb

## 2014-08-02 DIAGNOSIS — E559 Vitamin D deficiency, unspecified: Secondary | ICD-10-CM

## 2014-08-02 DIAGNOSIS — R945 Abnormal results of liver function studies: Secondary | ICD-10-CM

## 2014-08-02 DIAGNOSIS — E291 Testicular hypofunction: Secondary | ICD-10-CM

## 2014-08-02 DIAGNOSIS — E782 Mixed hyperlipidemia: Secondary | ICD-10-CM

## 2014-08-02 DIAGNOSIS — Z113 Encounter for screening for infections with a predominantly sexual mode of transmission: Secondary | ICD-10-CM

## 2014-08-02 DIAGNOSIS — Z1212 Encounter for screening for malignant neoplasm of rectum: Secondary | ICD-10-CM

## 2014-08-02 DIAGNOSIS — Z125 Encounter for screening for malignant neoplasm of prostate: Secondary | ICD-10-CM

## 2014-08-02 DIAGNOSIS — Z79899 Other long term (current) drug therapy: Secondary | ICD-10-CM | POA: Insufficient documentation

## 2014-08-02 DIAGNOSIS — Z0001 Encounter for general adult medical examination with abnormal findings: Secondary | ICD-10-CM

## 2014-08-02 DIAGNOSIS — R7989 Other specified abnormal findings of blood chemistry: Secondary | ICD-10-CM

## 2014-08-02 DIAGNOSIS — R7303 Prediabetes: Secondary | ICD-10-CM

## 2014-08-02 DIAGNOSIS — I10 Essential (primary) hypertension: Secondary | ICD-10-CM

## 2014-08-02 LAB — HEMOGLOBIN A1C
Hgb A1c MFr Bld: 6.2 % — ABNORMAL HIGH (ref <5.7)
Mean Plasma Glucose: 131 mg/dL — ABNORMAL HIGH (ref <117)

## 2014-08-02 LAB — CBC WITH DIFFERENTIAL/PLATELET
BASOS ABS: 0.1 10*3/uL (ref 0.0–0.1)
Basophils Relative: 1 % (ref 0–1)
EOS ABS: 0.2 10*3/uL (ref 0.0–0.7)
Eosinophils Relative: 2 % (ref 0–5)
HCT: 42.4 % (ref 39.0–52.0)
HEMOGLOBIN: 14.7 g/dL (ref 13.0–17.0)
LYMPHS PCT: 30 % (ref 12–46)
Lymphs Abs: 2.6 10*3/uL (ref 0.7–4.0)
MCH: 32 pg (ref 26.0–34.0)
MCHC: 34.7 g/dL (ref 30.0–36.0)
MCV: 92.4 fL (ref 78.0–100.0)
MPV: 9.2 fL — AB (ref 9.4–12.4)
Monocytes Absolute: 0.7 10*3/uL (ref 0.1–1.0)
Monocytes Relative: 8 % (ref 3–12)
Neutro Abs: 5 10*3/uL (ref 1.7–7.7)
Neutrophils Relative %: 59 % (ref 43–77)
PLATELETS: 292 10*3/uL (ref 150–400)
RBC: 4.59 MIL/uL (ref 4.22–5.81)
RDW: 13.4 % (ref 11.5–15.5)
WBC: 8.5 10*3/uL (ref 4.0–10.5)

## 2014-08-02 NOTE — Patient Instructions (Addendum)

## 2014-08-02 NOTE — Progress Notes (Signed)
Patient ID: Jared Carr, male   DOB: 1961/05/05, 53 y.o.   MRN: [id number]  Annual Screening Comprehensive Examination  This very nice 53 y.o.male presents for complete physical.  Patient has been followed for HTN, T2_NIDDM  Prediabetes, Hyperlipidemia, and Vitamin D Deficiency.   HTN predates since 53. Patient's BP has been controlled at home.Today's BP: 128/80 mmHg. Patient denies any cardiac symptoms as chest pain, palpitations, shortness of breath, dizziness or ankle swelling.   Patient's hyperlipidemia is controlled with diet and medications. Patient denies myalgias or other medication SE's. Last lipids were at goal - Total Chol 157; HDL 55; LDL 87; Trig 77 on 04/19/2014.   Patient has  prediabetes with A1c 6.0% since Oct 2012 and patient denies reactive hypoglycemic symptoms, visual blurring, diabetic polys or paresthesias. Last A1c was 5.9% on 04/19/2014.   Finally, patient has history of Vitamin D Deficiency of 24 in 2008 and last vitamin D was 98 on 04/19/2014.  Medication Sig  . aspirin 81 MG tablet Take 81 mg by mouth daily.  Marland Kitchen atenolol  100 MG tablet TAKE 1 TABLET BY MOUTH DAILY FOR BLOOD PRESSURE  . VITAMIN D 5000 UNITS CAPS Take by mouth daily.  . fenofibrate  134 MG capsule TAKE 1 CAPSULE BY MOUTH AT BEDTIME  . omeprazole 20 MG cap Take 1 capsule 2 x day with a meal for 10 days  .  DEPOTESTOTERONE  200 MG/ML  INJECT 1 AND 1/2 MILLILITERS EVERY 4 WEEKS   Allergies  Allergen Reactions  . Viagra [Sildenafil Citrate]     palpitations   Past Medical History  Diagnosis Date  . Hyperlipemia   . Hypertension   . Hypogonadism male   . Vitamin D deficiency    Health Maintenance  Topic Date Due  . COLONOSCOPY  05/27/2009  . INFLUENZA VACCINE  04/17/2014  . TETANUS/TDAP  06/17/2021   Immunization History  Administered Date(s) Administered  . Pneumococcal-Unspecified 10/12/1997  . Td 06/30/2001  . Tdap 06/18/2011   Past Surgical History  Procedure Laterality Date  .  Wisdom tooth extraction    . Orif finger fracture  03/27/2012    Procedure: OPEN REDUCTION INTERNAL FIXATION (ORIF) METACARPAL (FINGER) FRACTURE;  Surgeon: Cammie Sickle., MD;  Location: [facility];  Service: Orthopedics;  Laterality: Left;  Left small proximal phalanx   Family History  Problem Relation Age of Onset  . Diabetes Mother   . Hepatitis Father   . Cancer Father     liver   History   Social History  . Marital Status: Married    Spouse Name: N/A    Number of Children: N/A  . Years of Education: N/A   Occupational History  . Engineer, maintenance (IT) for [employer] History Main Topics  . Smoking status: Never Smoker   . Smokeless tobacco: Not on file  . Alcohol Use: 4.0 oz/week    8 drink(s) per week     Comment: occ  . Drug Use: Not on file  . Sexual Activity: Active     ROS Constitutional: Denies fever, chills, weight loss/gain, headaches, insomnia, fatigue, night sweats or change in appetite. Eyes: Denies redness, blurred vision, diplopia, discharge, itchy or watery eyes.  ENT: Denies discharge, congestion, post nasal drip, epistaxis, sore throat, earache, hearing loss, dental pain, Tinnitus, Vertigo, Sinus pain or snoring.  Cardio: Denies chest pain, palpitations, irregular heartbeat, syncope, dyspnea, diaphoresis, orthopnea, PND, claudication or edema Respiratory: denies cough, dyspnea, DOE, pleurisy, hoarseness, laryngitis or  wheezing.  Gastrointestinal: Denies dysphagia, heartburn, reflux, water brash, pain, cramps, nausea, vomiting, bloating, diarrhea, constipation, hematemesis, melena, hematochezia, jaundice or hemorrhoids Genitourinary: Denies dysuria, frequency, urgency, nocturia, hesitancy, discharge, hematuria or flank pain Musculoskeletal: Denies arthralgia, myalgia, stiffness, Jt. Swelling, pain, limp or strain/sprain. Denies Falls. Skin: Denies puritis, rash, hives, warts, acne, eczema or change in skin lesion Neuro: No weakness,  tremor, incoordination, spasms, paresthesia or pain Psychiatric: Denies confusion, memory loss or sensory loss. Denies Depression. Endocrine: Denies change in weight, skin, hair change, nocturia, and paresthesia, diabetic polys, visual blurring or hyper / hypo glycemic episodes.  Heme/Lymph: No excessive bleeding, bruising or enlarged lymph nodes.  Physical Exam  BP 128/80   Pulse 60  Temp 98.6 F   Resp 16  Ht 5' 9.5"   Wt 199 lb   BMI 28.98   General Appearance: Well nourished, in no apparent distress. Eyes: PERRLA, EOMs, conjunctiva no swelling or erythema, normal fundi and vessels. Sinuses: No frontal/maxillary tenderness ENT/Mouth: EACs patent / TMs  nl. Nares clear without erythema, swelling, mucoid exudates. Oral hygiene is good. No erythema, swelling, or exudate. Tongue normal, non-obstructing. Tonsils not swollen or erythematous. Hearing normal.  Neck: Supple, thyroid normal. No bruits, nodes or JVD. Respiratory: Respiratory effort normal.  BS equal and clear bilateral without rales, rhonci, wheezing or stridor. Cardio: Heart sounds are normal with regular rate and rhythm and no murmurs, rubs or gallops. Peripheral pulses are normal and equal bilaterally without edema. No aortic or femoral bruits. Chest: symmetric with normal excursions and percussion.  Abdomen: Flat, soft, with bowl sounds. Nontender, no guarding, rebound, hernias, masses, or organomegaly.  Lymphatics: Non tender without lymphadenopathy.  Genitourinary: No hernias.Testes nl. DRE - prostate nl for age - smooth & firm w/o nodules. Musculoskeletal: Full ROM all peripheral extremities, joint stability, 5/5 strength, and normal gait. Skin: Warm and dry without rashes, lesions, cyanosis, clubbing or  ecchymosis.  Neuro: Cranial nerves intact, reflexes equal bilaterally. Normal muscle tone, no cerebellar symptoms. Sensation intact.  Pysch: Awake and oriented X 3with normal affect, insight and judgment appropriate.    Assessment and Plan  1. Annual Screening Examination 2. Hypertension  3. Hyperlipidemia 4. Pre Diabetes 5. Vitamin D Deficiency 6. Testosterone Deficiency   Continue prudent diet as discussed, weight control, BP monitoring, regular exercise, and medications as discussed.  Discussed med effects and SE's. Routine screening labs and tests as requested with regular follow-up as recommended.

## 2014-08-03 LAB — MICROALBUMIN / CREATININE URINE RATIO
Creatinine, Urine: 160.3 mg/dL
MICROALB UR: 0.4 mg/dL (ref <2.0)
Microalb Creat Ratio: 2.5 mg/g (ref 0.0–30.0)

## 2014-08-03 LAB — VITAMIN B12: Vitamin B-12: >2000 pg/mL — ABNORMAL HIGH (ref 211–911)

## 2014-08-03 LAB — VITAMIN D 25 HYDROXY (VIT D DEFICIENCY, FRACTURES): VIT D 25 HYDROXY: 57 ng/mL (ref 30–100)

## 2014-08-03 LAB — URINALYSIS, MICROSCOPIC ONLY
BACTERIA UA: NONE SEEN
CASTS: NONE SEEN
CRYSTALS: NONE SEEN
Squamous Epithelial / LPF: NONE SEEN

## 2014-08-03 LAB — BASIC METABOLIC PANEL WITH GFR
BUN: 16 mg/dL (ref 6–23)
CO2: 24 mEq/L (ref 19–32)
CREATININE: 1.03 mg/dL (ref 0.50–1.35)
Calcium: 8.9 mg/dL (ref 8.4–10.5)
Chloride: 105 mEq/L (ref 96–112)
GFR, EST NON AFRICAN AMERICAN: 81 mL/min
GFR, Est African American: >89 mL/min
Glucose, Bld: 75 mg/dL (ref 70–99)
Potassium: 4 mEq/L (ref 3.5–5.3)
Sodium: 141 mEq/L (ref 135–145)

## 2014-08-03 LAB — HEPATIC FUNCTION PANEL
ALBUMIN: 4.1 g/dL (ref 3.5–5.2)
ALT: 21 U/L (ref 0–53)
AST: 19 U/L (ref 0–37)
Alkaline Phosphatase: 34 U/L — ABNORMAL LOW (ref 39–117)
BILIRUBIN DIRECT: 0.1 mg/dL (ref 0.0–0.3)
Indirect Bilirubin: 0.5 mg/dL (ref 0.2–1.2)
Total Bilirubin: 0.6 mg/dL (ref 0.2–1.2)
Total Protein: 6.5 g/dL (ref 6.0–8.3)

## 2014-08-03 LAB — PSA: PSA: 0.75 ng/mL (ref <=4.00)

## 2014-08-03 LAB — TESTOSTERONE: Testosterone: 445 ng/dL (ref 300–890)

## 2014-08-03 LAB — IRON AND TIBC
%SAT: 37 % (ref 20–55)
Iron: 129 ug/dL (ref 42–165)
TIBC: 353 ug/dL (ref 215–435)
UIBC: 224 ug/dL (ref 125–400)

## 2014-08-03 LAB — TSH: TSH: 2.148 u[IU]/mL (ref 0.350–4.500)

## 2014-08-03 LAB — MAGNESIUM: Magnesium: 1.7 mg/dL (ref 1.5–2.5)

## 2014-08-03 LAB — INSULIN, FASTING: Insulin fasting, serum: 6 u[IU]/mL (ref 2.0–19.6)

## 2014-08-04 ENCOUNTER — Telehealth: Payer: Self-pay

## 2014-08-04 NOTE — Telephone Encounter (Signed)
-----   Message from Unk Pinto, MD sent at 08/03/2014 11:25 PM EST ----- - Vit D 57 - a little low - Goal is betw 70-100 - Suggest increase from 5,000 to 10,000 units /day - All else excellent except Lipid profile still pending - may need to be reordered.

## 2014-08-04 NOTE — Telephone Encounter (Signed)
Left message for patient to return my call for lab results. 

## 2014-08-05 LAB — LIPID PANEL
Cholesterol: 157 mg/dL (ref 0–200)
HDL: 52 mg/dL (ref >39)
LDL CALC: 90 mg/dL (ref 0–99)
Total CHOL/HDL Ratio: 3 Ratio
Triglycerides: 73 mg/dL (ref <150)
VLDL: 15 mg/dL (ref 0–40)

## 2014-08-08 ENCOUNTER — Other Ambulatory Visit: Payer: Self-pay | Admitting: Physician Assistant

## 2014-08-30 ENCOUNTER — Other Ambulatory Visit: Payer: Self-pay

## 2014-08-30 MED ORDER — FENOFIBRATE MICRONIZED 134 MG PO CAPS: 134.0000 mg | ORAL_CAPSULE | Freq: Every day | ORAL | Status: DC

## 2014-11-10 ENCOUNTER — Ambulatory Visit: Payer: Self-pay | Admitting: Physician Assistant

## 2015-01-16 HISTORY — PX: COLONOSCOPY: SHX174

## 2015-01-24 ENCOUNTER — Ambulatory Visit (INDEPENDENT_AMBULATORY_CARE_PROVIDER_SITE_OTHER): Payer: BLUE CROSS/BLUE SHIELD | Admitting: Internal Medicine

## 2015-01-24 ENCOUNTER — Encounter: Payer: Self-pay | Admitting: Internal Medicine

## 2015-01-24 VITALS — BP 134/76 | HR 56 | Temp 97.5°F | Resp 16 | Ht 69.5 in | Wt 200.0 lb

## 2015-01-24 DIAGNOSIS — E559 Vitamin D deficiency, unspecified: Secondary | ICD-10-CM

## 2015-01-24 DIAGNOSIS — E782 Mixed hyperlipidemia: Secondary | ICD-10-CM

## 2015-01-24 DIAGNOSIS — Z1212 Encounter for screening for malignant neoplasm of rectum: Secondary | ICD-10-CM

## 2015-01-24 DIAGNOSIS — E291 Testicular hypofunction: Secondary | ICD-10-CM

## 2015-01-24 DIAGNOSIS — R1031 Right lower quadrant pain: Secondary | ICD-10-CM

## 2015-01-24 DIAGNOSIS — R7309 Other abnormal glucose: Secondary | ICD-10-CM

## 2015-01-24 DIAGNOSIS — I10 Essential (primary) hypertension: Secondary | ICD-10-CM

## 2015-01-24 DIAGNOSIS — Z79899 Other long term (current) drug therapy: Secondary | ICD-10-CM

## 2015-01-24 DIAGNOSIS — Z0001 Encounter for general adult medical examination with abnormal findings: Secondary | ICD-10-CM

## 2015-01-24 DIAGNOSIS — R5383 Other fatigue: Secondary | ICD-10-CM

## 2015-01-24 LAB — IRON AND TIBC
%SAT: 32 % (ref 20–55)
IRON: 108 ug/dL (ref 42–165)
TIBC: 337 ug/dL (ref 215–435)
UIBC: 229 ug/dL (ref 125–400)

## 2015-01-24 LAB — CBC WITH DIFFERENTIAL/PLATELET
BASOS ABS: 0.1 10*3/uL (ref 0.0–0.1)
BASOS PCT: 1 % (ref 0–1)
EOS ABS: 0.3 10*3/uL (ref 0.0–0.7)
Eosinophils Relative: 4 % (ref 0–5)
HCT: 42.6 % (ref 39.0–52.0)
Hemoglobin: 14.6 g/dL (ref 13.0–17.0)
Lymphocytes Relative: 39 % (ref 12–46)
Lymphs Abs: 3.3 10*3/uL (ref 0.7–4.0)
MCH: 31.3 pg (ref 26.0–34.0)
MCHC: 34.3 g/dL (ref 30.0–36.0)
MCV: 91.4 fL (ref 78.0–100.0)
MPV: 9.4 fL (ref 8.6–12.4)
Monocytes Absolute: 0.7 10*3/uL (ref 0.1–1.0)
Monocytes Relative: 8 % (ref 3–12)
Neutro Abs: 4.1 10*3/uL (ref 1.7–7.7)
Neutrophils Relative %: 48 % (ref 43–77)
PLATELETS: 318 10*3/uL (ref 150–400)
RBC: 4.66 MIL/uL (ref 4.22–5.81)
RDW: 13.1 % (ref 11.5–15.5)
WBC: 8.5 10*3/uL (ref 4.0–10.5)

## 2015-01-24 LAB — HEPATIC FUNCTION PANEL
ALBUMIN: 4.2 g/dL (ref 3.5–5.2)
ALT: 20 U/L (ref 0–53)
AST: 20 U/L (ref 0–37)
Alkaline Phosphatase: 38 U/L — ABNORMAL LOW (ref 39–117)
BILIRUBIN INDIRECT: 0.4 mg/dL (ref 0.2–1.2)
Bilirubin, Direct: 0.1 mg/dL (ref 0.0–0.3)
TOTAL PROTEIN: 6.8 g/dL (ref 6.0–8.3)
Total Bilirubin: 0.5 mg/dL (ref 0.2–1.2)

## 2015-01-24 LAB — BASIC METABOLIC PANEL WITH GFR
BUN: 24 mg/dL — ABNORMAL HIGH (ref 6–23)
CALCIUM: 9.5 mg/dL (ref 8.4–10.5)
CO2: 30 mEq/L (ref 19–32)
Chloride: 104 mEq/L (ref 96–112)
Creat: 1.18 mg/dL (ref 0.50–1.35)
GFR, EST AFRICAN AMERICAN: 80 mL/min
GFR, Est Non African American: 69 mL/min
Glucose, Bld: 82 mg/dL (ref 70–99)
POTASSIUM: 4.2 meq/L (ref 3.5–5.3)
Sodium: 142 mEq/L (ref 135–145)

## 2015-01-24 LAB — LIPID PANEL
Cholesterol: 179 mg/dL (ref 0–200)
HDL: 58 mg/dL (ref >=40)
LDL CALC: 103 mg/dL — AB (ref 0–99)
TRIGLYCERIDES: 92 mg/dL (ref <150)
Total CHOL/HDL Ratio: 3.1 Ratio
VLDL: 18 mg/dL (ref 0–40)

## 2015-01-24 LAB — MAGNESIUM: MAGNESIUM: 2 mg/dL (ref 1.5–2.5)

## 2015-01-24 LAB — HEMOGLOBIN A1C
HEMOGLOBIN A1C: 6 % — AB (ref <5.7)
MEAN PLASMA GLUCOSE: 126 mg/dL — AB (ref <117)

## 2015-01-24 LAB — TSH: TSH: 3.045 u[IU]/mL (ref 0.350–4.500)

## 2015-01-24 NOTE — Progress Notes (Signed)
Patient ID: Jared Carr, male   DOB: 12/04/1960, 54 y.o.   MRN: [id number]   This very nice 54 y.o. [name] presents for 6 mo follow up with Hypertension, Hyperlipidemia, Pre-Diabetes and Vitamin D Deficiency. Patient also is c/o of a several month hx of a vague RLQ abdominal discomfort/pain and a sensation of fullness in this area. No N/V/D/C/melena/hematochezia. He admits being overdue for his 54 yo screening Colonoscopy and is agreeable to scheduling now.   Patient is treated for HTN & BP has been controlled at home. Today's BP: 134/76 mmHg. Patient has had no complaints of any cardiac type chest pain, palpitations, dyspnea/orthopnea/PND, dizziness, claudication, or dependent edema.   Hyperlipidemia (Trig.s)  is controlled with diet & meds. Patient denies myalgias or other med SE's. Last Lipids were at goal - Total Chol 157; HDL 52; LDL  90; Trig 73 on 08/02/2014.    Also, the patient has history of PreDiabetes with A1c 6.0% in 2012 and 6.1% in 2014 and has had no symptoms of reactive hypoglycemia, diabetic polys, paresthesias or visual blurring.  Last A1c was 6.2% on 08/02/2014.    Further, the patient also has history of Vitamin D Deficiency of 24 in 2008 and supplements vitamin D without any suspected side-effects. Last vitamin D was  57 on 08/02/2014.    Medication Sig  . aspirin 81 MG tablet Take 81 mg  daily.  Marland Kitchen atenolol 100 MG tablet TAKE 1 TAB DAILY FOR BP  . VITAMIN D 5000 UNITS  Take by mouth daily.  . fenofibrate  134 MG cap Take 1 cap at bedtime.  Marland Kitchen DEPO-TESTOTERONE 200 MG/ML inj INJECT 1&1/2 ML   EVERY 4 WEEKS   Allergies  Allergen Reactions  . Viagra [Sildenafil Citrate]     palpitations   PMHx:   Past Medical History  Diagnosis Date  . Hyperlipemia   . Hypertension   . Hypogonadism male   . Vitamin D deficiency    Immunization History  Administered Date(s) Administered  . Pneumococcal-Unspecified 10/12/1997  . Td 06/30/2001  . Tdap 06/18/2011   Past Surgical  History  Procedure Laterality Date  . Wisdom tooth extraction    . Orif finger fracture  03/27/2012    Procedure: OPEN REDUCTION INTERNAL FIXATION (ORIF) METACARPAL (FINGER) FRACTURE;  Surgeon: Cammie Sickle., MD;  Location: [place] [facility];  Service: Orthopedics;  Laterality: Left;  Left small proximal phalanx   FHx:    Reviewed / unchanged  SHx:    Reviewed / unchanged  Systems Review:  Constitutional: Denies fever, chills, wt changes, headaches, insomnia, fatigue, night sweats, change in appetite. Eyes: Denies redness, blurred vision, diplopia, discharge, itchy, watery eyes.  ENT: Denies discharge, congestion, post nasal drip, epistaxis, sore throat, earache, hearing loss, dental pain, tinnitus, vertigo, sinus pain, snoring.  CV: Denies chest pain, palpitations, irregular heartbeat, syncope, dyspnea, diaphoresis, orthopnea, PND, claudication or edema. Respiratory: denies cough, dyspnea, DOE, pleurisy, hoarseness, laryngitis, wheezing.  Gastrointestinal: Denies dysphagia, odynophagia, heartburn, reflux, water brash, nausea, vomiting, bloating, diarrhea, constipation, hematemesis, melena, hematochezia  or hemorrhoids. Genitourinary: Denies dysuria, frequency, urgency, nocturia, hesitancy, discharge, hematuria or flank pain. Musculoskeletal: Denies arthralgias, myalgias, stiffness, jt. swelling, pain, limping or strain/sprain.  Skin: Denies pruritus, rash, hives, warts, acne, eczema or change in skin lesion(s). Neuro: No weakness, tremor, incoordination, spasms, paresthesia or pain. Psychiatric: Denies confusion, memory loss or sensory loss. Endo: Denies change in weight, skin or hair change.  Heme/Lymph: No excessive bleeding, bruising or enlarged lymph nodes.  Physical Exam  BP 134/76   Pulse 56  Temp 97.5 F   Resp 16  Ht 5' 9.5"   Wt 200 lb      BMI 29.12   Appears well nourished and in no distress. Eyes: PERRLA, EOMs, conjunctiva no swelling or  erythema. Sinuses: No frontal/maxillary tenderness ENT/Mouth: EAC's clear, TM's nl w/o erythema, bulging. Nares clear w/o erythema, swelling, exudates. Oropharynx clear without erythema or exudates. Oral hygiene is good. Tongue normal, non obstructing. Hearing intact.  Neck: Supple. Thyroid nl. Car 2+/2+ without bruits, nodes or JVD. Chest: Respirations nl with BS clear & equal w/o rales, rhonchi, wheezing or stridor.  Cor: Heart sounds normal w/ regular rate and rhythm without sig. murmurs, gallops, clicks, or rubs. Peripheral pulses normal and equal  without edema.  Abdomen: Soft & bowel sounds normal. Non-tender w/o guarding, rebound, hernias, masses, or organomegaly.  GU: No Hernias. Bifid Left testicle and otherwise testes nl/unremarkable. DRE: Nl prostate for age - firm & smooth w/o nodules. Hemoccult Negative Lymphatics: Unremarkable.  Musculoskeletal: Full ROM all peripheral extremities, joint stability, 5/5 strength, and normal gait.  Skin: Warm, dry without exposed rashes, lesions or ecchymosis apparent.  Neuro: Cranial nerves intact, reflexes equal bilaterally. Sensory-motor testing grossly intact. Tendon reflexes grossly intact.  Pysch: Alert & oriented x 3.  Insight and judgement nl & appropriate. No ideations.  Assessment and Plan:  1. Essential hypertension   2. Hyperlipidemia  - Lipid panel  3. Abnormal glucose  - Hemoglobin A1c - Insulin, random  4. Vitamin D deficiency  - Vit D  25 hydroxy   5. Testosterone Deficiency  - Testosterone  6. Screening for rectal cancer  - POC Hemoccult Bld/Stl   7. Other fatigue  - Iron and TIBC - TSH  8. Encounter for general adult medical examination with abnormal findings  - Urine Microscopic - CBC with Differential/Platelet - BASIC METABOLIC PANEL WITH GFR - Hepatic function panel - Magnesium  9. Abdominal pain, RLQ  - Ambulatory referral to Gastroenterology - CT Abdomen Pelvis W Contrast;  Future   Recommended regular exercise, BP monitoring, weight control, and discussed med and SE's. Recommended labs to assess and monitor clinical status. Further disposition pending results of labs. Over 30 minutes of exam, counseling, chart review was performed

## 2015-01-24 NOTE — Patient Instructions (Signed)
Recommend Low dose or baby Aspirin 81 mg daily   To reduce risk of Colon Cancer 20 %, Skin Cancer 26 % , Melanoma 46% and   Pancreatic cancer 60%  +++++++++++++++++++++++  Vitamin D goal is between 70-100.   Please make sure that you are taking your Vitamin D as directed.   It is very important as a natural antiinflammatory   helping hair, skin, and nails, as well as reducing stroke and heart attack risk.   It helps your bones and helps with mood.  It also decreases numerous cancer risks so please take it as directed.   Low Vit D is associated with a 200-300% higher risk for CANCER   and 200-300% higher risk for HEART   ATTACK  &  STROKE.    .....................................Marland Kitchen  It is also associated with higher death rate at younger ages,   autoimmune diseases like Rheumatoid arthritis, Lupus, Multiple Sclerosis.     Also many other serious conditions, like depression, Alzheimer's  Dementia, infertility, muscle aches, fatigue, fibromyalgia - just to name a few.  +++++++++++++++++++  Preventive Care for Adults  A healthy lifestyle and preventive care can promote health and wellness. Preventive health guidelines for men include the following key practices:  A routine yearly physical is a good way to check with your health care provider about your health and preventative screening. It is a chance to share any concerns and updates on your health and to receive a thorough exam.  Visit your dentist for a routine exam and preventative care every 6 months. Brush your teeth twice a day and floss once a day. Good oral hygiene prevents tooth decay and gum disease.  The frequency of eye exams is based on your age, health, family medical history, use of contact lenses, and other factors. Follow your health care provider's recommendations for frequency of eye exams.  Eat a healthy diet. Foods such as vegetables, fruits, whole grains, low-fat dairy products, and lean protein foods  contain the nutrients you need without too many calories. Decrease your intake of foods high in solid fats, added sugars, and salt. Eat the right amount of calories for you.Get information about a proper diet from your health care provider, if necessary.  Regular physical exercise is one of the most important things you can do for your health. Most adults should get at least 150 minutes of moderate-intensity exercise (any activity that increases your heart rate and causes you to sweat) each week. In addition, most adults need muscle-strengthening exercises on 2 or more days a week.  Maintain a healthy weight. The body mass index (BMI) is a screening tool to identify possible weight problems. It provides an estimate of body fat based on height and weight. Your health care provider can find your BMI and can help you achieve or maintain a healthy weight.For adults 20 years and older:  A BMI below 18.5 is considered underweight.  A BMI of 18.5 to 24.9 is normal.  A BMI of 25 to 29.9 is considered overweight.  A BMI of 30 and above is considered obese.  Maintain normal blood lipids and cholesterol levels by exercising and minimizing your intake of saturated fat. Eat a balanced diet with plenty of fruit and vegetables. Blood tests for lipids and cholesterol should begin at age 4 and be repeated every 5 years. If your lipid or cholesterol levels are high, you are over 50, or you are at high risk for heart disease, you may need your cholesterol  levels checked more frequently.Ongoing high lipid and cholesterol levels should be treated with medicines if diet and exercise are not working.  If you smoke, find out from your health care provider how to quit. If you do not use tobacco, do not start.  Lung cancer screening is recommended for adults aged 98-80 years who are at high risk for developing lung cancer because of a history of smoking. A yearly low-dose CT scan of the lungs is recommended for people  who have at least a 30-pack-year history of smoking and are a current smoker or have quit within the past 15 years. A pack year of smoking is smoking an average of 1 pack of cigarettes a day for 1 year (for example: 1 pack a day for 30 years or 2 packs a day for 15 years). Yearly screening should continue until the smoker has stopped smoking for at least 15 years. Yearly screening should be stopped for people who develop a health problem that would prevent them from having lung cancer treatment.  If you choose to drink alcohol, do not have more than 2 drinks per day. One drink is considered to be 12 ounces (355 mL) of beer, 5 ounces (148 mL) of wine, or 1.5 ounces (44 mL) of liquor.  Avoid use of street drugs. Do not share needles with anyone. Ask for help if you need support or instructions about stopping the use of drugs.  High blood pressure causes heart disease and increases the risk of stroke. Your blood pressure should be checked at least every 1-2 years. Ongoing high blood pressure should be treated with medicines, if weight loss and exercise are not effective.  If you are 61-37 years old, ask your health care provider if you should take aspirin to prevent heart disease.  Diabetes screening involves taking a blood sample to check your fasting blood sugar level. This should be done once every 3 years, after age 64, if you are within normal weight and without risk factors for diabetes. Testing should be considered at a younger age or be carried out more frequently if you are overweight and have at least 1 risk factor for diabetes.  Colorectal cancer can be detected and often prevented. Most routine colorectal cancer screening begins at the age of 67 and continues through age 36. However, your health care provider may recommend screening at an earlier age if you have risk factors for colon cancer. On a yearly basis, your health care provider may provide home test kits to check for hidden blood in the  stool. Use of a small camera at the end of a tube to directly examine the colon (sigmoidoscopy or colonoscopy) can detect the earliest forms of colorectal cancer. Talk to your health care provider about this at age 73, when routine screening begins. Direct exam of the colon should be repeated every 5-10 years through age 65, unless early forms of precancerous polyps or small growths are found.   Talk with your health care provider about prostate cancer screening.  Testicular cancer screening isrecommended for adult males. Screening includes self-exam, a health care provider exam, and other screening tests. Consult with your health care provider about any symptoms you have or any concerns you have about testicular cancer.  Use sunscreen. Apply sunscreen liberally and repeatedly throughout the day. You should seek shade when your shadow is shorter than you. Protect yourself by wearing long sleeves, pants, a wide-brimmed hat, and sunglasses year round, whenever you are outdoors.  Once a  month, do a whole-body skin exam, using a mirror to look at the skin on your back. Tell your health care provider about new moles, moles that have irregular borders, moles that are larger than a pencil eraser, or moles that have changed in shape or color.  Stay current with required vaccines (immunizations).  Influenza vaccine. All adults should be immunized every year.  Tetanus, diphtheria, and acellular pertussis (Td, Tdap) vaccine. An adult who has not previously received Tdap or who does not know his vaccine status should receive 1 dose of Tdap. This initial dose should be followed by tetanus and diphtheria toxoids (Td) booster doses every 10 years. Adults with an unknown or incomplete history of completing a 3-dose immunization series with Td-containing vaccines should begin or complete a primary immunization series including a Tdap dose. Adults should receive a Td booster every 10 years.  Varicella vaccine. An  adult without evidence of immunity to varicella should receive 2 doses or a second dose if he has previously received 1 dose.  Human papillomavirus (HPV) vaccine. Males aged 98-21 years who have not received the vaccine previously should receive the 3-dose series. Males aged 22-26 years may be immunized. Immunization is recommended through the age of 57 years for any male who has sex with males and did not get any or all doses earlier. Immunization is recommended for any person with an immunocompromised condition through the age of 16 years if he did not get any or all doses earlier. During the 3-dose series, the second dose should be obtained 4-8 weeks after the first dose. The third dose should be obtained 24 weeks after the first dose and 16 weeks after the second dose.  Zoster vaccine. One dose is recommended for adults aged 14 years or older unless certain conditions are present.    PREVNAR  - Pneumococcal 13-valent conjugate (PCV13) vaccine. When indicated, a person who is uncertain of his immunization history and has no record of immunization should receive the PCV13 vaccine. An adult aged 36 years or older who has certain medical conditions and has not been previously immunized should receive 1 dose of PCV13 vaccine. This PCV13 should be followed with a dose of pneumococcal polysaccharide (PPSV23) vaccine. The PPSV23 vaccine dose should be obtained at least 8 weeks after the dose of PCV13 vaccine. An adult aged 55 years or older who has certain medical conditions and previously received 1 or more doses of PPSV23 vaccine should receive 1 dose of PCV13. The PCV13 vaccine dose should be obtained 1 or more years after the last PPSV23 vaccine dose.    PNEUMOVAX - Pneumococcal polysaccharide (PPSV23) vaccine. When PCV13 is also indicated, PCV13 should be obtained first. All adults aged 87 years and older should be immunized. An adult younger than age 48 years who has certain medical conditions should  be immunized. Any person who resides in a nursing home or long-term care facility should be immunized. An adult smoker should be immunized. People with an immunocompromised condition and certain other conditions should receive both PCV13 and PPSV23 vaccines. People with human immunodeficiency virus (HIV) infection should be immunized as soon as possible after diagnosis. Immunization during chemotherapy or radiation therapy should be avoided. Routine use of PPSV23 vaccine is not recommended for American Indians, [place] Natives, or people younger than 65 years unless there are medical conditions that require PPSV23 vaccine. When indicated, people who have unknown immunization and have no record of immunization should receive PPSV23 vaccine. One-time revaccination 5 years after  the first dose of PPSV23 is recommended for people aged 19-64 years who have chronic kidney failure, nephrotic syndrome, asplenia, or immunocompromised conditions. People who received 1-2 doses of PPSV23 before age 60 years should receive another dose of PPSV23 vaccine at age 45 years or later if at least 5 years have passed since the previous dose. Doses of PPSV23 are not needed for people immunized with PPSV23 at or after age 66 years.    Hepatitis A vaccine. Adults who wish to be protected from this disease, have certain high-risk conditions, work with hepatitis A-infected animals, work in hepatitis A research labs, or travel to or work in countries with a high rate of hepatitis A should be immunized. Adults who were previously unvaccinated and who anticipate close contact with an international adoptee during the first 60 days after arrival in the [place] from a country with a high rate of hepatitis A should be immunized.    Hepatitis B vaccine. Adults should be immunized if they wish to be protected from this disease, have certain high-risk conditions, may be exposed to blood or other infectious body fluids, are household  contacts or sex partners of hepatitis B positive people, are clients or workers in certain care facilities, or travel to or work in countries with a high rate of hepatitis B.   Preventive Service / Frequency   Ages 87 to 71  Blood pressure check.  Lipid and cholesterol check  Lung cancer screening. / Every year if you are aged 61-80 years and have a 30-pack-year history of smoking and currently smoke or have quit within the past 15 years. Yearly screening is stopped once you have quit smoking for at least 15 years or develop a health problem that would prevent you from having lung cancer treatment.  Fecal occult blood test (FOBT) of stool. / Every year beginning at age 19 and continuing until age 92. You may not have to do this test if you get a colonoscopy every 10 years.  Flexible sigmoidoscopy** or colonoscopy.** / Every 5 years for a flexible sigmoidoscopy or every 10 years for a colonoscopy beginning at age 64 and continuing until age 32. Screening for abdominal aortic aneurysm (AAA)  by ultrasound is recommended for people who have history of high blood pressure or who are current or former smokers.

## 2015-01-25 ENCOUNTER — Encounter: Payer: Self-pay | Admitting: Internal Medicine

## 2015-01-25 LAB — TESTOSTERONE: Testosterone: 402 ng/dL (ref 300–890)

## 2015-01-25 LAB — URINALYSIS, MICROSCOPIC ONLY
BACTERIA UA: NONE SEEN
CASTS: NONE SEEN
Crystals: NONE SEEN
Squamous Epithelial / LPF: NONE SEEN

## 2015-01-25 LAB — INSULIN, RANDOM: Insulin: 7.7 u[IU]/mL (ref 2.0–19.6)

## 2015-01-25 LAB — VITAMIN D 25 HYDROXY (VIT D DEFICIENCY, FRACTURES): VIT D 25 HYDROXY: 58 ng/mL (ref 30–100)

## 2015-01-27 ENCOUNTER — Telehealth: Payer: Self-pay | Admitting: *Deleted

## 2015-01-27 NOTE — Telephone Encounter (Signed)
Amy or Pam,  You have an opening Monday afternoon.  If pt is able to, could he be seen by you on Monday?  He could have both procedures done at the same time on the 20th as scheduled ( I blocked Dr. Vena Rua 10:00 slot for this pt).  Thanks, [employer]

## 2015-01-27 NOTE — Telephone Encounter (Signed)
Dr. Hilarie Fredrickson,  Pt is coming in for his PV tomorrow.  His 01-24-15 office visit note with his GP states he has been having RLQ abd pain and he is overdue for his screening colonoscopy.  His referral is for abd pain.  Do you want an OV first or ok for direct?  Screening or abd pain?  Thanks, [employer]

## 2015-01-27 NOTE — Telephone Encounter (Signed)
I spoke with pt and told him he doesn't have to see [place] in [facility] tomorrow, but to keep his appointment with A Esterwood on 01-31-15

## 2015-01-27 NOTE — Telephone Encounter (Signed)
May need egd for upper abd pain and fullness Colon is for screening Would try to schedule him for both to hold a slot and have him see APP in our office 1st

## 2015-01-27 NOTE — Telephone Encounter (Signed)
Soke to patient and advised him we made him an appointment with Nicoletta Ba PA for Monday 01-31-2015. The appt is at 2:30 but he should arrive at 2:15 PM .  He is to bring his insurance cards, list of medications.  He should call us 24 hours ahead if he cannot make the appointment. Advised of cancellation fee.

## 2015-01-31 ENCOUNTER — Ambulatory Visit
Admission: RE | Admit: 2015-01-31 | Discharge: 2015-01-31 | Disposition: A | Discharge status: Home / Self Care | Payer: BLUE CROSS/BLUE SHIELD | Source: Ambulatory Visit | Attending: Internal Medicine | Admitting: Internal Medicine

## 2015-01-31 ENCOUNTER — Encounter: Payer: Self-pay | Admitting: Physician Assistant

## 2015-01-31 ENCOUNTER — Ambulatory Visit (INDEPENDENT_AMBULATORY_CARE_PROVIDER_SITE_OTHER): Payer: BLUE CROSS/BLUE SHIELD | Admitting: Physician Assistant

## 2015-01-31 VITALS — BP 130/80 | HR 64 | Ht 68.25 in | Wt 195.5 lb

## 2015-01-31 DIAGNOSIS — R1031 Right lower quadrant pain: Secondary | ICD-10-CM

## 2015-01-31 DIAGNOSIS — Z1211 Encounter for screening for malignant neoplasm of colon: Secondary | ICD-10-CM | POA: Diagnosis not present

## 2015-01-31 MED ORDER — IOHEXOL 300 MG/ML  SOLN
125.0000 mL | Freq: Once | INTRAMUSCULAR | Status: AC | PRN
  Administered 2015-01-31: 125 mL via INTRAVENOUS

## 2015-01-31 MED ORDER — NA SULFATE-K SULFATE-MG SULF 17.5-3.13-1.6 GM/177ML PO SOLN: 1.0000 | Freq: Once | ORAL | Status: DC

## 2015-01-31 NOTE — Progress Notes (Addendum)
Patient ID: Jared Carr, male   DOB: 1960/10/26, 54 y.o.   MRN: [id number]   Subjective:    Patient ID: Jared Carr, male    DOB: Apr 11, 1961, 54 y.o.   MRN: [id number]  HPI Corion is a pleasant 54 year old white male new to GI, referred by Dr. Melford Aase for evaluation of right lower quadrant pain and for screening colonoscopy. Patient states that he has been having right lower quadrant pain for about 3 months. Initially he thought that he may have pulled a muscle but discomfort has been persisting. He describes this as a dull ache deep in the right lower quadrant that sometimes radiates around towards his back. No  changes in his bowel habits, no melena or hematochezia. He says sometimes it's bit worse with straining for bowel movements or bending over. His appetite has been fine his weight has been stable he has no upper GI symptoms. He has noted some increased "grumbling"  in his abdomen especially after eating.  Patient had recent labs done which were unremarkable and had CT of the abdomen and pelvis done earlier today which was reviewed and unremarkable with no explanation for his right lower quadrant pain.  Review of Systems Pertinent positive and negative review of systems were noted in the above HPI section.  All other review of systems was otherwise negative.  Outpatient Encounter Prescriptions as of 01/31/2015  Medication Sig  . aspirin 81 MG tablet Take 81 mg by mouth daily.  Marland Kitchen atenolol (TENORMIN) 100 MG tablet TAKE 1 TABLET BY MOUTH DAILY FOR BLOOD PRESSURE  . Cholecalciferol (VITAMIN D3) 5000 UNITS CAPS Take by mouth daily.  . fenofibrate micronized (LOFIBRA) 134 MG capsule Take 1 capsule (134 mg total) by mouth at bedtime.  Marland Kitchen testosterone cypionate (DEPOTESTOTERONE CYPIONATE) 200 MG/ML injection INJECT 1 AND 1/2 MILLILITERS EVERY 4 WEEKS  . Na Sulfate-K Sulfate-Mg Sulf SOLN Take 1 kit by mouth once.   No facility-administered encounter medications on file as of 01/31/2015.    Allergies  Allergen Reactions  . Viagra [Sildenafil Citrate]     palpitations   Patient Active Problem List   Diagnosis Date Noted  . Encounter for general adult medical examination with abnormal findings 08/02/2014  . Abnormal glucose 01/05/2014  . Hyperlipidemia 01/05/2014  . Hypertension   . Testosterone Deficiency   . Vitamin D deficiency    History   Social History  . Marital Status: Married    Spouse Name: N/A  . Number of Children: 2  . Years of Education: N/A   Occupational History  . assembly    Social History Main Topics  . Smoking status: Never Smoker   . Smokeless tobacco: Never Used  . Alcohol Use: 4.8 oz/week    8 Standard drinks or equivalent per week     Comment: social  . Drug Use: No  . Sexual Activity: Not on file   Other Topics Concern  . Not on file   Social History Narrative    Mr. Warmuth's family history includes Breast cancer in his sister; Diabetes in his mother; Gallstones in his brother and mother; Hepatitis in his father; Liver cancer in his father.      Objective:    Filed Vitals:   01/31/15 1427  BP: 130/80  Pulse: 64    Physical Exam  well-developed white male in no acute distress, pleasant blood pressure 130/80 pulse 64 height 5 foot 8 weight 195. HEENT; nontraumatic normocephalic EOMI PERRLA sclera anicteric, Supple ;no JVD, Cardiovascular; regular  rate and rhythm with S1-S2 no murmur or gallop, Pulmonary; clear bilaterally, Abdomen; soft he has mild tenderness in the right lower quadrant laterally no palpable mass or hepatosplenomegaly bowel sounds are present, Rectal ;exam not done, EXT;no clubbing cyanosis or edema skin warm and dry, Neuropsych; mood and affect appropriate       Assessment & Plan:   #1 54 yo male with 3 month hx of RLQ dull pain- Negative Ct . Etiology is not clear ? Musculoskeletal Will r/o occult colon lesion #2 colon screening- no  prior colonoscopy/average risk  Plan; Will schedule for  Colonoscopy with Dr.Pyrtle. Procedure discussed in detail  with pt and he is agreeable to proceed. Further plans pending findings of above.    Vaniyah Lansky S Cedrik Heindl PA-C 01/31/2015   Cc: Unk Pinto, MD   Addendum: Reviewed and agree with initial management. Jerene Bears, MD

## 2015-01-31 NOTE — Patient Instructions (Signed)
You have been scheduled for a colonoscopy. Please follow written instructions given to you at your visit today.  Please pick up your prep supplies at the pharmacy within the next 1-3 days. [facility] [place], [place]. If you use inhalers (even only as needed), please bring them with you on the day of your procedure. Your physician has requested that you go to www.startemmi.com and enter the access code given to you at your visit today. This web site gives a general overview about your procedure. However, you should still follow specific instructions given to you by our office regarding your preparation for the procedure.

## 2015-02-04 ENCOUNTER — Encounter: Payer: Self-pay | Admitting: Internal Medicine

## 2015-02-04 ENCOUNTER — Ambulatory Visit (AMBULATORY_SURGERY_CENTER): Payer: BLUE CROSS/BLUE SHIELD | Admitting: Internal Medicine

## 2015-02-04 ENCOUNTER — Other Ambulatory Visit: Payer: Self-pay | Admitting: Internal Medicine

## 2015-02-04 VITALS — BP 106/62 | HR 58 | Temp 97.2°F | Resp 20 | Ht 68.25 in | Wt 195.0 lb

## 2015-02-04 DIAGNOSIS — D120 Benign neoplasm of cecum: Secondary | ICD-10-CM

## 2015-02-04 DIAGNOSIS — D122 Benign neoplasm of ascending colon: Secondary | ICD-10-CM

## 2015-02-04 DIAGNOSIS — Z1211 Encounter for screening for malignant neoplasm of colon: Secondary | ICD-10-CM | POA: Diagnosis present

## 2015-02-04 MED ORDER — SODIUM CHLORIDE 0.9 % IV SOLN: 500.0000 mL | INTRAVENOUS | Status: DC

## 2015-02-04 NOTE — Progress Notes (Signed)
Called to room to assist during endoscopic procedure.  Patient ID and intended procedure confirmed with present staff. Received instructions for my participation in the procedure from the performing physician.  

## 2015-02-04 NOTE — Progress Notes (Signed)
Report to PACU, RN, vss, BBS= Clear.  

## 2015-02-04 NOTE — Patient Instructions (Signed)
YOU HAD AN ENDOSCOPIC PROCEDURE TODAY AT [facility]:   Refer to the procedure report that was given to you for any specific questions about what was found during the examination.  If the procedure report does not answer your questions, please call your gastroenterologist to clarify.  If you requested that your care partner not be given the details of your procedure findings, then the procedure report has been included in a sealed envelope for you to review at your convenience later.  YOU SHOULD EXPECT: Some feelings of bloating in the abdomen. Passage of more gas than usual.  Walking can help get rid of the air that was put into your GI tract during the procedure and reduce the bloating. If you had a lower endoscopy (such as a colonoscopy or flexible sigmoidoscopy) you may notice spotting of blood in your stool or on the toilet paper. If you underwent a bowel prep for your procedure, you may not have a normal bowel movement for a few days.  Please Note:  You might notice some irritation and congestion in your nose or some drainage.  This is from the oxygen used during your procedure.  There is no need for concern and it should clear up in a day or so.  SYMPTOMS TO REPORT IMMEDIATELY:   Following lower endoscopy (colonoscopy or flexible sigmoidoscopy):  Excessive amounts of blood in the stool  Significant tenderness or worsening of abdominal pains  Swelling of the abdomen that is new, acute  Fever of 100F or higher     For urgent or emergent issues, a gastroenterologist can be reached at any hour by calling [phone].   DIET: Your first meal following the procedure should be a small meal and then it is ok to progress to your normal diet. Heavy or fried foods are harder to digest and may make you feel nauseous or bloated.  Likewise, meals heavy in dairy and vegetables can increase bloating.  Drink plenty of fluids but you should avoid alcoholic beverages for 24  hours.  ACTIVITY:  You should plan to take it easy for the rest of today and you should NOT DRIVE or use heavy machinery until tomorrow (because of the sedation medicines used during the test).    FOLLOW UP: Our staff will call the number listed on your records the next business day following your procedure to check on you and address any questions or concerns that you may have regarding the information given to you following your procedure. If we do not reach you, we will leave a message.  However, if you are feeling well and you are not experiencing any problems, there is no need to return our call.  We will assume that you have returned to your regular daily activities without incident.  If any biopsies were taken you will be contacted by phone or by letter within the next 1-3 weeks.  Please call us at [phone] if you have not heard about the biopsies in 3 weeks.    SIGNATURES/CONFIDENTIALITY: You and/or your care partner have signed paperwork which will be entered into your electronic medical record.  These signatures attest to the fact that that the information above on your After Visit Summary has been reviewed and is understood.  Full responsibility of the confidentiality of this discharge information lies with you and/or your care-partner.    INFORMATION ON POLYPS,DIVERTICULOSIS,&HIGH FIBER DIET GIVEN TO YOU TODAY

## 2015-02-04 NOTE — Op Note (Signed)
[place]  [address]. [place], [id number]   COLONOSCOPY PROCEDURE REPORT  PATIENT: Jared Carr, Jared Carr  MR#: [id number] BIRTHDATE: Sep 29, 1960 , 53  yrs. old GENDER: male ENDOSCOPIST: Jerene Bears, MD REFERRED [id number] Melford Aase, M.D. PROCEDURE DATE:  02/04/2015 PROCEDURE:   Colonoscopy, screening and Colonoscopy with snare polypectomy First Screening Colonoscopy - Avg.  risk and is 50 yrs.  old or older Yes.  Prior Negative Screening - Now for repeat screening. N/A  History of Adenoma - Now for follow-up colonoscopy & has been > or = to 3 yrs.  N/A  Polyps removed today? Yes ASA CLASS:   Class II INDICATIONS:Screening for colonic neoplasia and Colorectal Neoplasm Risk Assessment for this procedure is average risk. MEDICATIONS: Monitored anesthesia care and Propofol 300 mg IV  DESCRIPTION OF PROCEDURE:   After the risks benefits and alternatives of the procedure were thoroughly explained, informed consent was obtained.  The digital rectal exam revealed no rectal mass.   The LB CF-H180AL Loaner [id number]  endoscope was introduced through the anus and advanced to the terminal ileum which was intubated for a short distance. No adverse events experienced. The quality of the prep was good.  (Suprep was used)  The instrument was then slowly withdrawn as the colon was fully examined. Estimated blood loss is zero unless otherwise noted in this procedure report.  COLON FINDINGS: The examined terminal ileum appeared to be normal. Three sessile polyps ranging from 3 to 12mm in size were found at the cecum (2) and in the ascending colon (1).  Polypectomies were performed with a cold snare.  The resection was complete, the polyp tissue was completely retrieved and sent to histology.   There was mild diverticulosis noted in the left colon.   The examination was otherwise normal.  Retroflexed views revealed internal hemorrhoids. The time to cecum = 2.2 Withdrawal time = 12.6    The scope was withdrawn and the procedure completed. COMPLICATIONS: There were no immediate complications.  ENDOSCOPIC IMPRESSION: 1.   The examined terminal ileum appeared to be normal 2.   Three sessile polyps ranging from 3 to 28mm in size were found at the cecum and in the ascending colon; polypectomies were performed with a cold snare 3.   Mild diverticulosis was noted in the left colon 4.   The examination was otherwise normal  RECOMMENDATIONS: 1.  Await pathology results 2.  High fiber diet 3.  Timing of repeat colonoscopy will be determined by pathology findings. 4.  You will receive a letter within 1-2 weeks with the results of your biopsy as well as final recommendations.  Please call my office if you have not received a letter after 3 weeks.  eSigned:  Jerene Bears, MD 02/04/2015 10:17 AM     cc: Unk Pinto, MD and The Patient

## 2015-02-07 ENCOUNTER — Telehealth: Payer: Self-pay

## 2015-02-07 NOTE — Telephone Encounter (Signed)
  Follow up Call-  Call back number 02/04/2015  Post procedure Call Back phone  # [phone]  Permission to leave phone message Yes     Patient questions:  Do you have a fever, pain , or abdominal swelling? No. Pain Score  0 *  Have you tolerated food without any problems? Yes.    Have you been able to return to your normal activities? Yes.    Do you have any questions about your discharge instructions: Diet   No. Medications  No. Follow up visit  No.  Do you have questions or concerns about your Care? No.  Actions: * If pain score is 4 or above: No action needed, pain <4.

## 2015-02-12 ENCOUNTER — Other Ambulatory Visit: Payer: Self-pay | Admitting: Internal Medicine

## 2015-02-13 ENCOUNTER — Encounter: Payer: Self-pay | Admitting: Internal Medicine

## 2015-02-22 ENCOUNTER — Ambulatory Visit: Payer: BLUE CROSS/BLUE SHIELD | Admitting: Internal Medicine

## 2015-02-23 NOTE — Progress Notes (Signed)
Patient ID: Jared Carr, male   DOB: March 04, 1961, 54 y.o.   MRN: [id number]  [name]

## 2015-06-01 ENCOUNTER — Encounter: Payer: Self-pay | Admitting: Internal Medicine

## 2015-06-01 ENCOUNTER — Ambulatory Visit (INDEPENDENT_AMBULATORY_CARE_PROVIDER_SITE_OTHER): Payer: BLUE CROSS/BLUE SHIELD | Admitting: Internal Medicine

## 2015-06-01 VITALS — BP 126/62 | HR 64 | Temp 98.4°F | Resp 18 | Ht 69.5 in | Wt 199.0 lb

## 2015-06-01 DIAGNOSIS — B029 Zoster without complications: Secondary | ICD-10-CM

## 2015-06-01 MED ORDER — TRAMADOL HCL 50 MG PO TABS: 50.0000 mg | ORAL_TABLET | Freq: Four times a day (QID) | ORAL | Status: DC | PRN

## 2015-06-01 MED ORDER — TESTOSTERONE CYPIONATE 200 MG/ML IM SOLN: INTRAMUSCULAR | Status: DC

## 2015-06-01 MED ORDER — PREDNISONE 20 MG PO TABS: ORAL_TABLET | ORAL | Status: DC

## 2015-06-01 MED ORDER — DOXYCYCLINE HYCLATE 100 MG PO CAPS: 100.0000 mg | ORAL_CAPSULE | Freq: Two times a day (BID) | ORAL | Status: DC

## 2015-06-01 NOTE — Patient Instructions (Signed)

## 2015-06-01 NOTE — Progress Notes (Signed)
   Subjective:    Patient ID: Jared Carr, male    DOB: 1960/11/10, 54 y.o.   MRN: [id number]  Rash Pertinent negatives include no fatigue, fever or vomiting.  Patient presents to the office for evaluation of rash on the left thigh x 4-5 days.  Patient reports that the rash is very painful to the touch and he also has noticed some redness and some spreading of pimples.  He reports that he tried calamine lotion at home which didn't help.  He did have chicken pox when he was younger.  No new personal products.  Nobody else has a rash.  No plant exposure.      Review of Systems  Constitutional: Negative for fever, chills and fatigue.  Gastrointestinal: Negative for nausea and vomiting.  Musculoskeletal: Positive for myalgias. Negative for joint swelling.  Skin: Positive for rash.       Objective:   Physical Exam  Constitutional: He is oriented to person, place, and time. He appears well-developed and well-nourished. No distress.  HENT:  Head: Normocephalic.  Mouth/Throat: Oropharynx is clear and moist. No oropharyngeal exudate.  Eyes: Conjunctivae are normal. No scleral icterus.  Neck: Normal range of motion. Neck supple. No JVD present. No thyromegaly present.  Cardiovascular: Normal rate, regular rhythm, normal heart sounds and intact distal pulses.  Exam reveals no gallop and no friction rub.   No murmur heard. Pulmonary/Chest: Effort normal and breath sounds normal. No respiratory distress. He has no wheezes. He has no rales. He exhibits no tenderness.  Abdominal: Soft. Bowel sounds are normal. He exhibits no distension and no mass. There is no tenderness. There is no rebound and no guarding.  Lymphadenopathy:    He has no cervical adenopathy.  Neurological: He is alert and oriented to person, place, and time. No cranial nerve deficit. Coordination normal.  Skin: Skin is warm and dry. Rash noted. He is not diaphoretic.     Psychiatric: He has a normal mood and affect. His  behavior is normal. Judgment and thought content normal.  Nursing note and vitals reviewed.   Filed Vitals:   06/01/15 1407  BP: 126/62  Pulse: 64  Temp: 98.4 F (36.9 C)  Resp: 18          Assessment & Plan:    1. Herpes zoster -outside 72 hour window.  Prednisone, tramadol prn.   -lesion on buttock appears to be infected.  Will finish course of doxycyline.

## 2015-08-08 ENCOUNTER — Ambulatory Visit (INDEPENDENT_AMBULATORY_CARE_PROVIDER_SITE_OTHER): Payer: BLUE CROSS/BLUE SHIELD | Admitting: Internal Medicine

## 2015-08-08 ENCOUNTER — Encounter: Payer: Self-pay | Admitting: Internal Medicine

## 2015-08-08 VITALS — BP 120/76 | HR 64 | Temp 97.7°F | Resp 16 | Ht 69.5 in | Wt 195.8 lb

## 2015-08-08 DIAGNOSIS — Z0001 Encounter for general adult medical examination with abnormal findings: Secondary | ICD-10-CM

## 2015-08-08 DIAGNOSIS — Z6828 Body mass index (BMI) 28.0-28.9, adult: Secondary | ICD-10-CM

## 2015-08-08 DIAGNOSIS — Z125 Encounter for screening for malignant neoplasm of prostate: Secondary | ICD-10-CM

## 2015-08-08 DIAGNOSIS — Z0000 Encounter for general adult medical examination without abnormal findings: Secondary | ICD-10-CM

## 2015-08-08 DIAGNOSIS — R7303 Prediabetes: Secondary | ICD-10-CM

## 2015-08-08 DIAGNOSIS — I10 Essential (primary) hypertension: Secondary | ICD-10-CM

## 2015-08-08 DIAGNOSIS — R5383 Other fatigue: Secondary | ICD-10-CM

## 2015-08-08 DIAGNOSIS — Z111 Encounter for screening for respiratory tuberculosis: Secondary | ICD-10-CM

## 2015-08-08 DIAGNOSIS — E291 Testicular hypofunction: Secondary | ICD-10-CM

## 2015-08-08 DIAGNOSIS — E782 Mixed hyperlipidemia: Secondary | ICD-10-CM

## 2015-08-08 DIAGNOSIS — Z79899 Other long term (current) drug therapy: Secondary | ICD-10-CM

## 2015-08-08 DIAGNOSIS — E559 Vitamin D deficiency, unspecified: Secondary | ICD-10-CM

## 2015-08-08 DIAGNOSIS — R7309 Other abnormal glucose: Secondary | ICD-10-CM

## 2015-08-08 DIAGNOSIS — Z1212 Encounter for screening for malignant neoplasm of rectum: Secondary | ICD-10-CM

## 2015-08-08 NOTE — Progress Notes (Signed)
Patient ID: Jared Carr, male   DOB: [birth year], 54 y.o.   MRN: [id number]  Annual Screening/Preventative Visit  And  Comprehensive Evaluation & Examination  This very nice 54 y.o. [name] presents for presents for a Wellness/Preventative Visit & comprehensive evaluation and management of multiple medical co-morbidities.  Patient has been followed for HTN, Prediabetes, Hyperlipidemia, Testosterone and Vitamin D Deficiency.   HTN predates since 34. Patient's BP has been controlled at home.Today's BP: 120/76 mmHg. Patient denies any cardiac symptoms as chest pain, palpitations, shortness of breath, dizziness or ankle swelling.   Patient's hyperlipidemia is controlled with diet and medications. Patient denies myalgias or other medication SE's. Last lipids were near goal Cholesterol 179; HDL 58; LDL 103*; Triglycerides 92 on 01/24/2015.   Patient has prediabetes since 2012 with A1c 6.0% and then 6.1% in 2014 and patient denies reactive hypoglycemic symptoms, visual blurring, diabetic polys or paresthesias. Last A1c was 6.0% on 01/24/2015.   Patient has hx/o Low T anjd has been on replacement by Depo-Testosterone injections with improved libido, stamina, erectile performance. Finally, patient has history of Vitamin D Deficiency of 24 in 2008 and last vitamin D was 58 on 01/24/2015.   Medication Sig  . aspirin 81 MG tablet Take 81 mg by mouth daily.  Marland Kitchen atenolol  100 MG tablet TAKE 1 TABLET BY MOUTH DAILY FOR BLOOD PRESSURE  . VITAMIN D 5000 UNITS CAPS Take by mouth daily.  . fenofibrate  134 MG capsule Take 1 capsule (134 mg total) by mouth at bedtime.  Marland Kitchen DEPO-TESTOSTERONE  200 MG/ML inj INJECT 1 AND 1/2 MILLILITERS EVERY 4 WEEKS   Allergies  Allergen Reactions  . Viagra [Sildenafil Citrate]     palpitations   Past Medical History  Diagnosis Date  . Hyperlipemia   . Hypertension   . Hypogonadism male   . Vitamin D deficiency    Health Maintenance  Topic Date Due  . Hepatitis C Screening   [birth year]  . HIV Screening  05/27/1974  . INFLUENZA VACCINE  04/18/2015  . COLONOSCOPY  02/03/2018  . TETANUS/TDAP  06/17/2021   Immunization History  Administered Date(s) Administered  . PPD Test 08/08/2015  . Pneumococcal-Unspecified 10/12/1997  . Td 06/30/2001  . Tdap 06/18/2011   Past Surgical History  Procedure Laterality Date  . Wisdom tooth extraction    . Orif finger fracture  03/27/2012    Procedure: OPEN REDUCTION INTERNAL FIXATION (ORIF) METACARPAL (FINGER) FRACTURE;  Surgeon: Cammie Sickle., MD;  Location: [facility];  Service: Orthopedics;  Laterality: Left;  Left small proximal phalanx   Family History  Problem Relation Age of Onset  . Diabetes Mother   . Gallstones Mother   . Hepatitis Father   . Liver cancer Father   . Breast cancer Sister   . Gallstones Brother   . Colon cancer Neg Hx     Social History   Social History  . Marital Status: Married    Spouse Name: N/A  . Number of Children: 2  . Years of Education: N/A   Occupational History  . Assembly / Production for [employer] History Main Topics  . Smoking status: Never Smoker   . Smokeless tobacco: Never Used  . Alcohol Use: 4.8 oz/week    8 Standard drinks or equivalent per week     Comment: social  . Drug Use: No  . Sexual Activity: Active    ROS Constitutional: Denies fever, chills, weight loss/gain, headaches, insomnia,  night  sweats or change in appetite. Does c/o fatigue. Eyes: Denies redness, blurred vision, diplopia, discharge, itchy or watery eyes.  ENT: Denies discharge, congestion, post nasal drip, epistaxis, sore throat, earache, hearing loss, dental pain, Tinnitus, Vertigo, Sinus pain or snoring.  Cardio: Denies chest pain, palpitations, irregular heartbeat, syncope, dyspnea, diaphoresis, orthopnea, PND, claudication or edema Respiratory: denies cough, dyspnea, DOE, pleurisy, hoarseness, laryngitis or wheezing.  Gastrointestinal: Denies dysphagia,  heartburn, reflux, water brash, pain, cramps, nausea, vomiting, bloating, diarrhea, constipation, hematemesis, melena, hematochezia, jaundice or hemorrhoids Genitourinary: Denies dysuria, frequency, urgency, nocturia, hesitancy, discharge, hematuria or flank pain Musculoskeletal: Denies arthralgia, myalgia, stiffness, Jt. Swelling, pain, limp or strain/sprain. Denies Falls. Skin: Denies puritis, rash, hives, warts, acne, eczema or change in skin lesion Neuro: No weakness, tremor, incoordination, spasms, paresthesia or pain Psychiatric: Denies confusion, memory loss or sensory loss. Denies Depression. Endocrine: Denies change in weight, skin, hair change, nocturia, and paresthesia, diabetic polys, visual blurring or hyper / hypo glycemic episodes.  Heme/Lymph: No excessive bleeding, bruising or enlarged lymph nodes.  Physical Exam  BP 120/76 mmHg  Pulse 64  Temp(Src) 97.7 F (36.5 C)  Resp 16  Ht 5' 9.5" (1.765 m)  Wt 195 lb 12.8 oz (88.814 kg)  BMI 28.51 kg/m2  General Appearance: Well nourished, in no apparent distress. Eyes: PERRLA, EOMs, conjunctiva no swelling or erythema, normal fundi and vessels. Sinuses: No frontal/maxillary tenderness ENT/Mouth: EACs patent / TMs  nl. Nares clear without erythema, swelling, mucoid exudates. Oral hygiene is good. No erythema, swelling, or exudate. Tongue normal, non-obstructing. Tonsils not swollen or erythematous. Hearing normal.  Neck: Supple, thyroid normal. No bruits, nodes or JVD. Respiratory: Respiratory effort normal.  BS equal and clear bilateral without rales, rhonci, wheezing or stridor. Cardio: Heart sounds are normal with regular rate and rhythm and no murmurs, rubs or gallops. Peripheral pulses are normal and equal bilaterally without edema. No aortic or femoral bruits. Chest: symmetric with normal excursions and percussion.  Abdomen: Flat, soft, with bowl sounds. Nontender, no guarding, rebound, hernias, masses, or organomegaly.   Lymphatics: Non tender without lymphadenopathy.  Genitourinary: No hernias.Testes nl. DRE - prostate nl for age - smooth & firm w/o nodules. Musculoskeletal: Full ROM all peripheral extremities, joint stability, 5/5 strength, and normal gait. Skin: Warm and dry without rashes, lesions, cyanosis, clubbing or  ecchymosis.  Neuro: Cranial nerves intact, reflexes equal bilaterally. Normal muscle tone, no cerebellar symptoms. Sensation intact.  Pysch: Awake and oriented X 3 with normal affect, insight and judgment appropriate.   Assessment and Plan  1. Annual Preventative/Screening Exam   - Microalbumin / creatinine urine ratio - EKG 12-Lead - [place], RETROPERITNL ABD,  LTD - POC Hemoccult Bld/Stl  - Urinalysis, Routine w reflex microscopic  - Vitamin B12 - Iron and TIBC - PSA - Testosterone - CBC with Differential/Platelet - BASIC METABOLIC PANEL WITH GFR - Hepatic function panel - Magnesium - Lipid panel - TSH - Hemoglobin A1c - Insulin, random - VITAMIN D 25 Hydroxy  - PPD  2. Essential hypertension  - Microalbumin / creatinine urine ratio - EKG 12-Lead - [place], RETROPERITNL ABD,  LTD - TSH  3. Hyperlipidemia  - Lipid panel  4. Abnormal glucose  - Hemoglobin A1c - Insulin, random  5. Vitamin D deficiency  - VITAMIN D 25 Hydroxy  6. Prediabetes   7. Testosterone Deficiency  - Testosterone  8. Screening for rectal cancer  - POC Hemoccult Bld/Stl   9. Prostate cancer screening  - PSA - Testosterone  10.  Screening examination for pulmonary tuberculosis  - PPD  11. BMI 28.0-28.9,adult   12. Other fatigue  - Vitamin B12 - Iron and TIBC - TSH  13. Medication management  - Urinalysis, Routine w reflex microscopic  - CBC with Differential/Platelet - BASIC METABOLIC PANEL WITH GFR - Hepatic function panel - Magnesium   Continue prudent diet as discussed, weight control, BP monitoring, regular exercise, and medications as discussed.  Discussed med  effects and SE's. Routine screening labs and tests as requested with regular follow-up as recommended.

## 2015-08-08 NOTE — Patient Instructions (Signed)

## 2015-08-09 LAB — CBC WITH DIFFERENTIAL/PLATELET
BASOS ABS: 0.1 10*3/uL (ref 0.0–0.1)
BASOS PCT: 1 % (ref 0–1)
EOS PCT: 2 % (ref 0–5)
Eosinophils Absolute: 0.2 10*3/uL (ref 0.0–0.7)
HEMATOCRIT: 45.1 % (ref 39.0–52.0)
Hemoglobin: 15 g/dL (ref 13.0–17.0)
LYMPHS PCT: 27 % (ref 12–46)
Lymphs Abs: 2.2 10*3/uL (ref 0.7–4.0)
MCH: 30.9 pg (ref 26.0–34.0)
MCHC: 33.3 g/dL (ref 30.0–36.0)
MCV: 93 fL (ref 78.0–100.0)
MPV: 9.1 fL (ref 8.6–12.4)
Monocytes Absolute: 0.7 10*3/uL (ref 0.1–1.0)
Monocytes Relative: 9 % (ref 3–12)
Neutro Abs: 5.1 10*3/uL (ref 1.7–7.7)
Neutrophils Relative %: 61 % (ref 43–77)
PLATELETS: 353 10*3/uL (ref 150–400)
RBC: 4.85 MIL/uL (ref 4.22–5.81)
RDW: 13.2 % (ref 11.5–15.5)
WBC: 8.3 10*3/uL (ref 4.0–10.5)

## 2015-08-09 LAB — HEPATIC FUNCTION PANEL
ALK PHOS: 36 U/L — AB (ref 40–115)
ALT: 18 U/L (ref 9–46)
AST: 16 U/L (ref 10–35)
Albumin: 3.9 g/dL (ref 3.6–5.1)
BILIRUBIN DIRECT: 0.2 mg/dL (ref <=0.2)
BILIRUBIN INDIRECT: 0.5 mg/dL (ref 0.2–1.2)
BILIRUBIN TOTAL: 0.7 mg/dL (ref 0.2–1.2)
Total Protein: 6.4 g/dL (ref 6.1–8.1)

## 2015-08-09 LAB — URINALYSIS, ROUTINE W REFLEX MICROSCOPIC
BILIRUBIN URINE: NEGATIVE
Glucose, UA: NEGATIVE
Hgb urine dipstick: NEGATIVE
KETONES UR: NEGATIVE
Leukocytes, UA: NEGATIVE
Nitrite: NEGATIVE
PROTEIN: NEGATIVE
Specific Gravity, Urine: 1.02 (ref 1.001–1.035)
pH: 5 (ref 5.0–8.0)

## 2015-08-09 LAB — LIPID PANEL
CHOL/HDL RATIO: 2.7 ratio (ref <=5.0)
CHOLESTEROL: 151 mg/dL (ref 125–200)
HDL: 55 mg/dL (ref >=40)
LDL CALC: 83 mg/dL (ref <130)
TRIGLYCERIDES: 66 mg/dL (ref <150)
VLDL: 13 mg/dL (ref <30)

## 2015-08-09 LAB — PSA: PSA: 0.61 ng/mL (ref <=4.00)

## 2015-08-09 LAB — HEMOGLOBIN A1C
Hgb A1c MFr Bld: 5.9 % — ABNORMAL HIGH (ref <5.7)
Mean Plasma Glucose: 123 mg/dL — ABNORMAL HIGH (ref <117)

## 2015-08-09 LAB — BASIC METABOLIC PANEL WITH GFR
BUN: 20 mg/dL (ref 7–25)
CO2: 25 mmol/L (ref 20–31)
Calcium: 9.2 mg/dL (ref 8.6–10.3)
Chloride: 105 mmol/L (ref 98–110)
Creat: 0.99 mg/dL (ref 0.70–1.33)
GFR, EST NON AFRICAN AMERICAN: 85 mL/min (ref >=60)
GLUCOSE: 74 mg/dL (ref 65–99)
POTASSIUM: 4.1 mmol/L (ref 3.5–5.3)
SODIUM: 143 mmol/L (ref 135–146)

## 2015-08-09 LAB — MAGNESIUM: Magnesium: 1.7 mg/dL (ref 1.5–2.5)

## 2015-08-09 LAB — MICROALBUMIN / CREATININE URINE RATIO
Creatinine, Urine: 193 mg/dL (ref 20–370)
MICROALB/CREAT RATIO: 3 ug/mg{creat} (ref <30)
Microalb, Ur: 0.5 mg/dL

## 2015-08-09 LAB — TESTOSTERONE: Testosterone: 329 ng/dL (ref 300–890)

## 2015-08-09 LAB — IRON AND TIBC
%SAT: 48 % (ref 15–60)
Iron: 171 ug/dL (ref 50–180)
TIBC: 355 ug/dL (ref 250–425)
UIBC: 184 ug/dL (ref 125–400)

## 2015-08-09 LAB — VITAMIN B12: VITAMIN B 12: 382 pg/mL (ref 211–911)

## 2015-08-09 LAB — TSH: TSH: 1.777 u[IU]/mL (ref 0.350–4.500)

## 2015-08-09 LAB — INSULIN, RANDOM: Insulin: 7.1 u[IU]/mL (ref 2.0–19.6)

## 2015-08-09 LAB — VITAMIN D 25 HYDROXY (VIT D DEFICIENCY, FRACTURES): Vit D, 25-Hydroxy: 59 ng/mL (ref 30–100)

## 2015-08-25 ENCOUNTER — Other Ambulatory Visit: Payer: Self-pay | Admitting: Internal Medicine

## 2015-08-30 ENCOUNTER — Other Ambulatory Visit: Payer: Self-pay | Admitting: Internal Medicine

## 2015-10-14 ENCOUNTER — Ambulatory Visit (INDEPENDENT_AMBULATORY_CARE_PROVIDER_SITE_OTHER): Payer: BLUE CROSS/BLUE SHIELD | Admitting: Physician Assistant

## 2015-10-14 ENCOUNTER — Encounter: Payer: Self-pay | Admitting: Physician Assistant

## 2015-10-14 VITALS — BP 128/66 | HR 58 | Temp 98.1°F | Resp 16 | Ht 69.5 in | Wt 202.0 lb

## 2015-10-14 DIAGNOSIS — I8393 Asymptomatic varicose veins of bilateral lower extremities: Secondary | ICD-10-CM | POA: Diagnosis not present

## 2015-10-14 NOTE — Progress Notes (Signed)
   Subjective:    Patient ID: Jared Carr, male    DOB: 25-Nov-1960, 55 y.o.   MRN: [id number]  HPI 55 y.o. non smoking WM with history of HTN, chol presents with discoloration at left ankle x 8 months. He works on concrete all day, has some pain left ankle at the end of the day, has some throbbing, some swelling, denies redness/warmth.   Blood pressure 128/66, pulse 58, temperature 98.1 F (36.7 C), temperature source Temporal, resp. rate 16, height 5' 9.5" (1.765 m), weight 202 lb (91.627 kg), SpO2 99 %.  Current Outpatient Prescriptions on File Prior to Visit  Medication Sig Dispense Refill  . aspirin 81 MG tablet Take 81 mg by mouth daily.    Marland Kitchen atenolol (TENORMIN) 100 MG tablet TAKE 1 TABLET BY MOUTH DAILY FOR BLOOD PRESSURE 90 tablet 0  . Cholecalciferol (VITAMIN D3) 5000 UNITS CAPS Take by mouth daily.    . fenofibrate micronized (LOFIBRA) 134 MG capsule TAKE 1 CAPSULE (134 MG TOTAL) BY MOUTH AT BEDTIME. 90 capsule 3  . testosterone cypionate (DEPOTESTOSTERONE CYPIONATE) 200 MG/ML injection INJECT 1 AND 1/2 MILLILITERS EVERY 4 WEEKS 10 mL 1  . traMADol (ULTRAM) 50 MG tablet Take 1 tablet (50 mg total) by mouth every 6 (six) hours as needed. 30 tablet 0   No current facility-administered medications on file prior to visit.   Past Medical History  Diagnosis Date  . Hyperlipemia   . Hypertension   . Hypogonadism male   . Vitamin D deficiency     Review of Systems  Constitutional: Negative.   HENT: Negative.   Respiratory: Negative.   Cardiovascular: Negative.   Gastrointestinal: Negative.   Genitourinary: Negative.   Musculoskeletal: Negative.   Skin: Positive for color change. Negative for pallor, rash and wound.  Neurological: Negative.   Hematological: Negative.        Objective:   Physical Exam  Constitutional: He is oriented to person, place, and time. He appears well-developed and well-nourished.  Neck: Normal range of motion. Neck supple.  Cardiovascular:  Normal rate and regular rhythm.  Exam reveals no decreased pulses.   Pulses:      Popliteal pulses are 2+ on the right side, and 2+ on the left side.       Dorsalis pedis pulses are 2+ on the right side, and 2+ on the left side.       Posterior tibial pulses are 2+ on the right side, and 2+ on the left side.  Pulmonary/Chest: Effort normal and breath sounds normal.  Abdominal: Soft. Bowel sounds are normal. There is no tenderness.  Musculoskeletal: Normal range of motion. He exhibits no edema (no distal edema).  Lymphadenopathy:    He has no cervical adenopathy.  Neurological: He is alert and oriented to person, place, and time.  Skin: Skin is warm and dry. No rash noted.  Patient has bilateral dilated varicose veins on legs without erythema, warmth, has small varicose veins with stasis dermatitis bilateral ankles, normal distal vascular exam.        Assessment & Plan:  Varicose veins- weight loss discussed, continue compression stockings and elevation Patient reassured.

## 2015-10-14 NOTE — Patient Instructions (Signed)
Varicose Veins Varicose veins are veins that have become enlarged and twisted. CAUSES This condition is the result of valves in the veins not working properly. Valves in the veins help return blood from the leg to the heart. When your calf muscles squeeze, the blood moves up your leg then the valves close and this continues until the blood gets back to your heart.  If these valves are damaged, blood flows backwards and backs up into the veins in the leg near the skin OR if your are sitting/standing for a long time without using your calf muscles the blood will back up into the veins in your legs. This causes the veins to become larger. People who are on their feet a lot, sit a lot without walking (like on a plane, at a desk, or in a car), who are pregnant, or who are overweight are more likely to develop varicose veins. SYMPTOMS   Bulging, twisted-appearing, bluish veins, most commonly found on the legs.  Leg pain or a feeling of heaviness. These symptoms may be worse at the end of the day.  Leg swelling.  Skin color changes. DIAGNOSIS  Varicose veins can usually be diagnosed with an exam of your legs by your caregiver. He or she may recommend an ultrasound of your leg veins. TREATMENT  Most varicose veins can be treated at home.However, other treatments are available for people who have persistent symptoms or who want to treat the cosmetic appearance of the varicose veins. But this is only cosmetic and they will return if not properly treated. These include:  Laser treatment of very small varicose veins.  Medicine that is shot (injected) into the vein. This medicine hardens the walls of the vein and closes off the vein. This treatment is called sclerotherapy. Afterwards, you may need to wear clothing or bandages that apply pressure.  Surgery. HOME CARE INSTRUCTIONS   Do not stand or sit in one position for long periods of time. Do not sit with your legs crossed. Rest with your legs raised  during the day.  Your legs have to be higher than your heart so that gravity will force the valves to open, so please really elevate your legs.   Wear elastic stockings or support hose. Do not wear other tight, encircling garments around the legs, pelvis, or waist.  [employer]  has a wide variety of well priced compression stockings. [address], [place] [address] [phone]  OR THERE ARE COPPER INFUSED COMPRESSION SOCKS AT [facility] OR [facility]  Walk as much as possible to increase blood flow.  Raise the foot of your bed at night with 2-inch blocks.  If you get a cut in the skin over the vein and the vein bleeds, lie down with your leg raised and press on it with a clean cloth until the bleeding stops. Then place a bandage (dressing) on the cut. See your caregiver if it continues to bleed or needs stitches. SEEK MEDICAL CARE IF:   The skin around your ankle starts to break down.  You have pain, redness, tenderness, or hard swelling developing in your leg over a vein.  You are uncomfortable due to leg pain. Document Released: 06/13/2005 Document Revised: 11/26/2011 Document Reviewed: 10/30/2010 [employer] Patient Information 2014 [employer].   

## 2015-11-09 ENCOUNTER — Ambulatory Visit: Payer: Self-pay | Admitting: Internal Medicine

## 2015-11-15 ENCOUNTER — Other Ambulatory Visit: Payer: Self-pay | Admitting: Internal Medicine

## 2015-11-15 ENCOUNTER — Ambulatory Visit (INDEPENDENT_AMBULATORY_CARE_PROVIDER_SITE_OTHER): Payer: BLUE CROSS/BLUE SHIELD | Admitting: Internal Medicine

## 2015-11-15 ENCOUNTER — Encounter: Payer: Self-pay | Admitting: Internal Medicine

## 2015-11-15 VITALS — BP 122/70 | HR 64 | Temp 98.0°F | Resp 18 | Ht 69.5 in | Wt 200.0 lb

## 2015-11-15 DIAGNOSIS — E559 Vitamin D deficiency, unspecified: Secondary | ICD-10-CM

## 2015-11-15 DIAGNOSIS — I10 Essential (primary) hypertension: Secondary | ICD-10-CM

## 2015-11-15 DIAGNOSIS — E782 Mixed hyperlipidemia: Secondary | ICD-10-CM

## 2015-11-15 DIAGNOSIS — Z79899 Other long term (current) drug therapy: Secondary | ICD-10-CM | POA: Diagnosis not present

## 2015-11-15 DIAGNOSIS — E291 Testicular hypofunction: Secondary | ICD-10-CM

## 2015-11-15 DIAGNOSIS — R7309 Other abnormal glucose: Secondary | ICD-10-CM

## 2015-11-15 NOTE — Progress Notes (Signed)
Assessment and Plan:  Hypertension:  -Continue medication,  -monitor blood pressure at home.  -Continue DASH diet.   -Reminder to go to the ER if any CP, SOB, nausea, dizziness, severe HA, changes vision/speech, left arm numbness and tingling, and jaw pain.  Cholesterol: -Continue diet and exercise.    Pre-diabetes: -Continue diet and exercise.    Vitamin D Def: -continue medications.   Defer labs until next visit due to cost and stable labs.  Will check them at next visit.    Continue diet and meds as discussed. Further disposition pending results of labs.  HPI 55 y.o. male  presents for 3 month follow up with hypertension, hyperlipidemia, prediabetes and vitamin D.   His blood pressure has been controlled at home, today their BP is BP: 122/70 mmHg.   He does workout. He denies chest pain, shortness of breath, dizziness.   He is on cholesterol medication and denies myalgias. His cholesterol is at goal. The cholesterol last visit was:   Lab Results  Component Value Date   CHOL 151 08/08/2015   HDL 55 08/08/2015   LDLCALC 83 08/08/2015   TRIG 66 08/08/2015   CHOLHDL 2.7 08/08/2015     He has been working on diet and exercise for prediabetes, and denies foot ulcerations, hyperglycemia, hypoglycemia , increased appetite, nausea, paresthesia of the feet, polydipsia, polyuria, visual disturbances, vomiting and weight loss. Last A1C in the office was:  Lab Results  Component Value Date   HGBA1C 5.9* 08/08/2015    Patient is on Vitamin D supplement.  Lab Results  Component Value Date   VD25OH 66 08/08/2015     Patient reports that he took last testosterone shot 4 days ago.  He is doing well on injections.  No fatigue or tiredness out of the ordinary.  Feels like these help.    Current Medications:  Current Outpatient Prescriptions on File Prior to Visit  Medication Sig Dispense Refill  . aspirin 81 MG tablet Take 81 mg by mouth daily.    Marland Kitchen atenolol (TENORMIN) 100 MG  tablet TAKE 1 TABLET BY MOUTH DAILY FOR BLOOD PRESSURE 90 tablet 0  . Cholecalciferol (VITAMIN D3) 5000 UNITS CAPS Take by mouth daily.    . fenofibrate micronized (LOFIBRA) 134 MG capsule TAKE 1 CAPSULE (134 MG TOTAL) BY MOUTH AT BEDTIME. 90 capsule 3  . testosterone cypionate (DEPOTESTOSTERONE CYPIONATE) 200 MG/ML injection INJECT 1 AND 1/2 MILLILITERS EVERY 4 WEEKS 10 mL 1   No current facility-administered medications on file prior to visit.    Medical History:  Past Medical History  Diagnosis Date  . Hyperlipemia   . Hypertension   . Hypogonadism male   . Vitamin D deficiency     Allergies:  Allergies  Allergen Reactions  . Viagra [Sildenafil Citrate]     palpitations     Review of Systems:  Review of Systems  Constitutional: Negative for fever, chills and malaise/fatigue.  HENT: Negative for congestion, ear pain, nosebleeds and sore throat.   Eyes: Negative for blurred vision, double vision and discharge.  Respiratory: Negative for cough, shortness of breath and wheezing.   Cardiovascular: Negative for chest pain, palpitations and leg swelling.  Gastrointestinal: Negative for heartburn, abdominal pain, diarrhea, constipation, blood in stool and melena.  Genitourinary: Negative.   Skin: Negative.   Neurological: Negative for dizziness, sensory change, loss of consciousness and headaches.  Psychiatric/Behavioral: Negative for depression. The patient is not nervous/anxious and does not have insomnia.     Family history-  Review and unchanged  Social history- Review and unchanged  Physical Exam: BP 122/70 mmHg  Pulse 64  Temp(Src) 98 F (36.7 C) (Temporal)  Resp 18  Ht 5' 9.5" (1.765 m)  Wt 200 lb (90.719 kg)  BMI 29.12 kg/m2 Wt Readings from Last 3 Encounters:  11/15/15 200 lb (90.719 kg)  10/14/15 202 lb (91.627 kg)  08/08/15 195 lb 12.8 oz (88.814 kg)    General Appearance: Well nourished well developed, in no apparent distress. Eyes: PERRLA, EOMs,  conjunctiva no swelling or erythema ENT/Mouth: Ear canals normal without obstruction, swelling, erythma, discharge.  TMs normal bilaterally.  Oropharynx moist, clear, without exudate, or postoropharyngeal swelling. Neck: Supple, thyroid normal,no cervical adenopathy  Respiratory: Respiratory effort normal, Breath sounds clear A&P without rhonchi, wheeze, or rale.  No retractions, no accessory usage. Cardio: RRR with no MRGs. Brisk peripheral pulses without edema.  Abdomen: Soft, + BS,  Non tender, no guarding, rebound, hernias, masses. Musculoskeletal: Full ROM, 5/5 strength, Normal gait Skin: Warm, dry without rashes, lesions, ecchymosis.  Neuro: Awake and oriented X 3, Cranial nerves intact. Normal muscle tone, no cerebellar symptoms. Psych: Normal affect, Insight and Judgment appropriate.    Starlyn Skeans, PA-C 4:49 PM [facility]

## 2015-11-20 ENCOUNTER — Other Ambulatory Visit: Payer: Self-pay | Admitting: Internal Medicine

## 2015-12-29 ENCOUNTER — Other Ambulatory Visit: Payer: Self-pay | Admitting: *Deleted

## 2015-12-29 MED ORDER — FENOFIBRATE MICRONIZED 134 MG PO CAPS: ORAL_CAPSULE | ORAL | Status: DC

## 2016-02-09 ENCOUNTER — Ambulatory Visit: Payer: Self-pay | Admitting: Internal Medicine

## 2016-02-09 NOTE — Progress Notes (Signed)
Patient ID: Jared Carr, male   DOB: 10/27/1960, 55 y.o.   MRN: [id number] [name]

## 2016-02-14 ENCOUNTER — Encounter: Payer: Self-pay | Admitting: Physician Assistant

## 2016-02-14 ENCOUNTER — Ambulatory Visit (INDEPENDENT_AMBULATORY_CARE_PROVIDER_SITE_OTHER): Payer: BLUE CROSS/BLUE SHIELD | Admitting: Physician Assistant

## 2016-02-14 VITALS — BP 130/80 | HR 66 | Temp 97.9°F | Resp 16 | Ht 69.5 in | Wt 197.4 lb

## 2016-02-14 DIAGNOSIS — J0100 Acute maxillary sinusitis, unspecified: Secondary | ICD-10-CM | POA: Diagnosis not present

## 2016-02-14 MED ORDER — PREDNISONE 20 MG PO TABS: ORAL_TABLET | ORAL | Status: DC

## 2016-02-14 MED ORDER — PROMETHAZINE-CODEINE 6.25-10 MG/5ML PO SYRP: 5.0000 mL | ORAL_SOLUTION | Freq: Four times a day (QID) | ORAL | Status: DC | PRN

## 2016-02-14 MED ORDER — FLUTICASONE PROPIONATE 50 MCG/ACT NA SUSP: 2.0000 | Freq: Every day | NASAL | Status: DC

## 2016-02-14 MED ORDER — AZITHROMYCIN 250 MG PO TABS: ORAL_TABLET | ORAL | Status: AC

## 2016-02-14 NOTE — Progress Notes (Signed)
   Subjective:    Patient ID: Jared Carr, male    DOB: [birth year], 55 y.o.   MRN: [id number]  HPI 55 y.o. WM presents with sinus issues x 5 days. Has taken mucinex DM. Felt chills this AM but no fevers. Has cough without mucus, sinus pressure, sore throat that has resolved.   Blood pressure 130/80, pulse 66, temperature 97.9 F (36.6 C), temperature source Temporal, resp. rate 16, height 5' 9.5" (1.765 m), weight 197 lb 6.4 oz (89.54 kg), SpO2 96 %.   Review of Systems  Constitutional: Negative for fever, chills and diaphoresis.  HENT: Positive for congestion, sinus pressure, sneezing and sore throat. Negative for ear pain, trouble swallowing and voice change.   Eyes: Negative.   Respiratory: Positive for cough. Negative for chest tightness, shortness of breath and wheezing.   Cardiovascular: Negative.   Gastrointestinal: Negative.   Genitourinary: Negative.   Musculoskeletal: Negative for neck pain.  Neurological: Negative.  Negative for headaches.       Objective:   Physical Exam  Constitutional: He is oriented to person, place, and time. He appears well-developed and well-nourished.  HENT:  Head: Normocephalic and atraumatic.  Right Ear: Hearing and tympanic membrane normal.  Left Ear: Hearing and tympanic membrane normal.  Nose: Right sinus exhibits maxillary sinus tenderness. Left sinus exhibits maxillary sinus tenderness.  Mouth/Throat: Uvula is midline and mucous membranes are normal. Posterior oropharyngeal erythema present. No oropharyngeal exudate, posterior oropharyngeal edema or tonsillar abscesses.  Eyes: Conjunctivae are normal. Pupils are equal, round, and reactive to light.  Neck: Normal range of motion. Neck supple.  Cardiovascular: Normal rate and regular rhythm.   Pulmonary/Chest: Effort normal and breath sounds normal.  Abdominal: Soft. Bowel sounds are normal.  Musculoskeletal: Normal range of motion.  Lymphadenopathy:    He has no cervical adenopathy.   Neurological: He is alert and oriented to person, place, and time.  Skin: Skin is warm and dry. No rash noted.       Assessment & Plan:  1. Acute maxillary sinusitis, recurrence not specified Will hold the zpak and take if she is not getting better, increase fluids, rest, cont allergy pill - azithromycin (ZITHROMAX) 250 MG tablet; Take 2 tablets (500 mg) on  Day 1,  followed by 1 tablet (250 mg) once daily on Days 2 through 5.  Dispense: 6 each; Refill: 1 - predniSONE (DELTASONE) 20 MG tablet; 2 tablets daily for 3 days, 1 tablet daily for 4 days.  Dispense: 10 tablet; Refill: 0 - fluticasone (FLONASE) 50 MCG/ACT nasal spray; Place 2 sprays into both nostrils at bedtime.  Dispense: 16 g; Refill: 1 - promethazine-codeine (PHENERGAN WITH CODEINE) 6.25-10 MG/5ML syrup; Take 5 mLs by mouth every 6 (six) hours as needed for cough. Max: 71mL per day  Dispense: 240 mL; Refill: 0

## 2016-02-14 NOTE — Patient Instructions (Addendum)
Please take the prednisone to help decrease inflammation and therefore decrease symptoms. Take it it with food to avoid GI upset. It can cause increased energy but on the other hand it can make it hard to sleep at night so please take it AT [place], it takes 8-12 hours to start working so it will NOT affect your sleeping if you take it at night with your food!!  If you are diabetic it will increase your sugars so decrease carbs and monitor your sugars closely.    Get on allergy pill  HOW TO TREAT VIRAL COUGH AND COLD SYMPTOMS:  -Symptoms usually last at least 1 week with the worst symptoms being around day 4.  - colds usually start with a sore throat and end with a cough, and the cough can take 2 weeks to get better.  -No antibiotics are needed for colds, flu, sore throats, cough, bronchitis UNLESS symptoms are longer than 7 days OR if you are getting better then get drastically worse.  -There are a lot of combination medications (Dayquil, Nyquil, Vicks 44, tyelnol cold and sinus, ETC). Please look at the ingredients on the back so that you are treating the correct symptoms and not doubling up on medications/ingredients.    Medicines you can use  Nasal congestion  - pseudoephedrine (Sudafed)- behind the counter, do not use if you have high blood pressure, medicine that have -D in them.  - phenylephrine (Sudafed PE) -Dextormethorphan + chlorpheniramine (Coridcidin HBP)- okay if you have high blood pressure -Oxymetazoline (Afrin) nasal spray- LIMIT to 3 days -Saline nasal spray -Neti pot (used distilled or bottled water)  Ear pain/congestion  -pseudoephedrine (sudafed) - Nasonex/flonase nasal spray  Fever  -Acetaminophen (Tyelnol) -Ibuprofen (Advil, motrin, aleve)  Sore Throat  -Acetaminophen (Tyelnol) -Ibuprofen (Advil, motrin, aleve) -Drink a lot of water -Gargle with salt water - Rest your voice (don't talk) -Throat sprays -Cough drops  Body Aches  -Acetaminophen  (Tyelnol) -Ibuprofen (Advil, motrin, aleve)  Headache  -Acetaminophen (Tyelnol) -Ibuprofen (Advil, motrin, aleve) - Exedrin, Exedrin Migraine  Allergy symptoms (cough, sneeze, runny nose, itchy eyes) -Claritin or loratadine cheapest but likely the weakest  -Zyrtec or certizine at night because it can make you sleepy -The strongest is allegra or fexafinadine  Cheapest at [employer], [employer], [employer]  Cough  -Dextromethorphan (Delsym)- medicine that has DM in it -Guafenesin (Mucinex/Robitussin) - cough drops - drink lots of water  Chest Congestion  -Guafenesin (Mucinex/Robitussin)  Red Itchy Eyes  - Naphcon-A  Upset Stomach  - Bland diet (nothing spicy, greasy, fried, and high acid foods like tomatoes, oranges, berries) -OKAY- cereal, bread, soup, crackers, rice -Eat smaller more frequent meals -reduce caffeine, no alcohol -Loperamide (Imodium-AD) if diarrhea -Prevacid for heart burn  General health when sick  -Hydration -wash your hands frequently -keep surfaces clean -change pillow cases and sheets often -Get fresh air but do not exercise strenuously -Vitamin D, double up on it - Vitamin C -Zinc

## 2016-03-16 IMAGING — CT CT ABD-PELV W/ CM
1 of 3 series · 14 of 32 positions shown, 19 images · IV contrast (APPLIED)
Comparison: None.

CLINICAL DATA: Right lower quadrant pain

EXAM:
CT ABDOMEN AND PELVIS WITH CONTRAST
TECHNIQUE: Multidetector CT imaging of the abdomen and pelvis was performed
using the standard protocol following bolus administration of
intravenous contrast.
CONTRAST:  125mL OMNIPAQUE IOHEXOL 300 MG/ML  SOLN

[Series 2: abd pelvis 5.0 i41s 1 · axial · 0.82mm/px · z∈[-519,-49]mm · 14 of 106 slices shown, 19 images]
[im 6/106  soft-tissue]
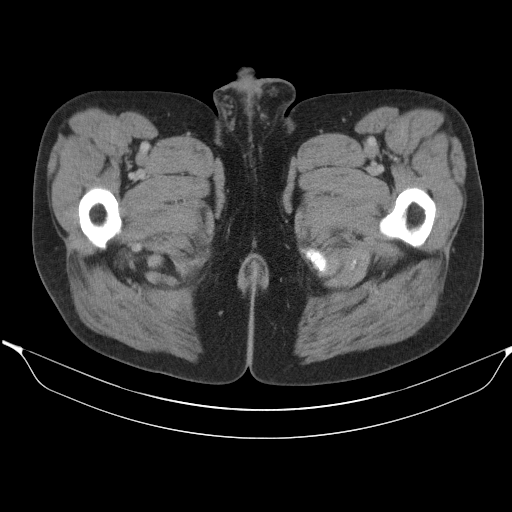
[im 6/106  bone]
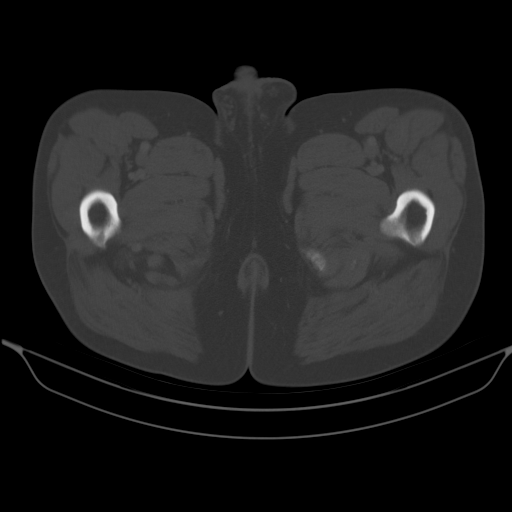
[im 16/106  soft-tissue]
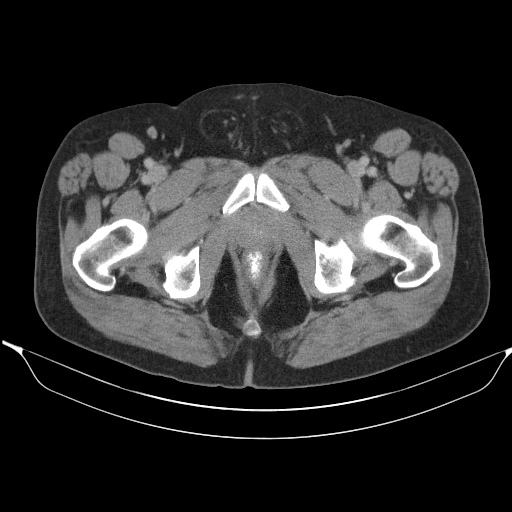
[im 22/106  soft-tissue]
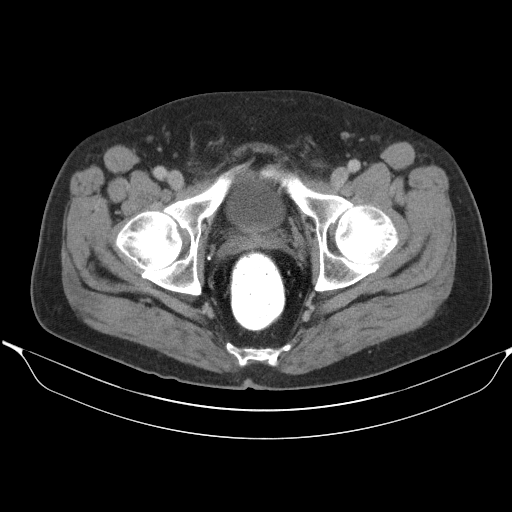
[im 32/106  soft-tissue]
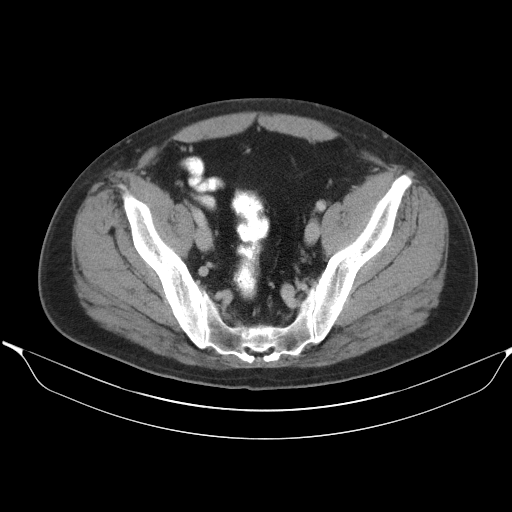
[im 37/106  soft-tissue]
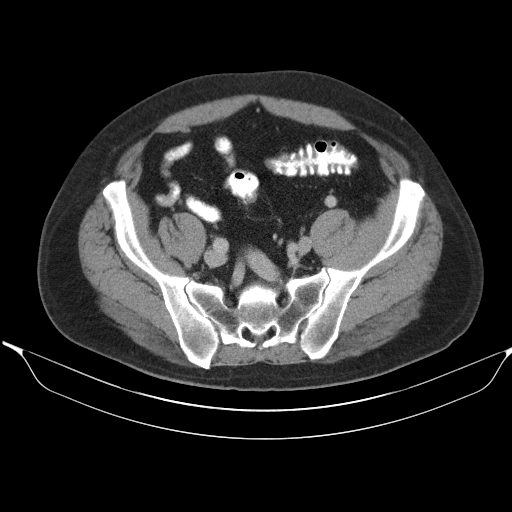
[im 48/106  soft-tissue]
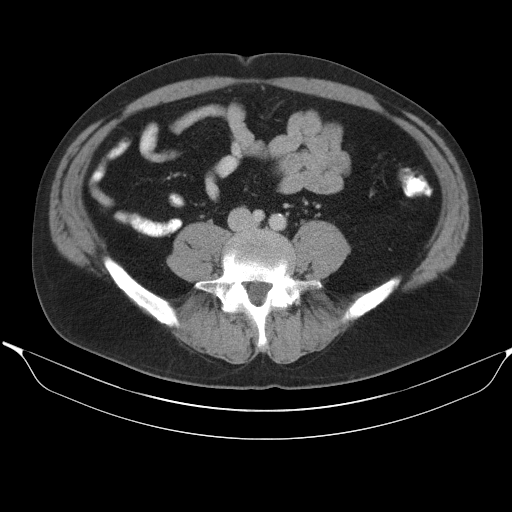
[im 53/106  soft-tissue]
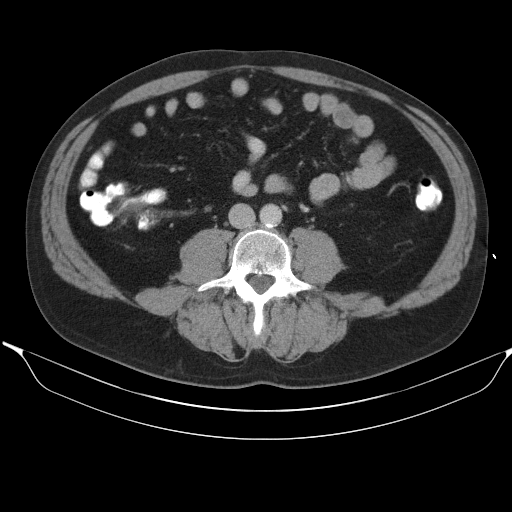
[im 58/106  soft-tissue]
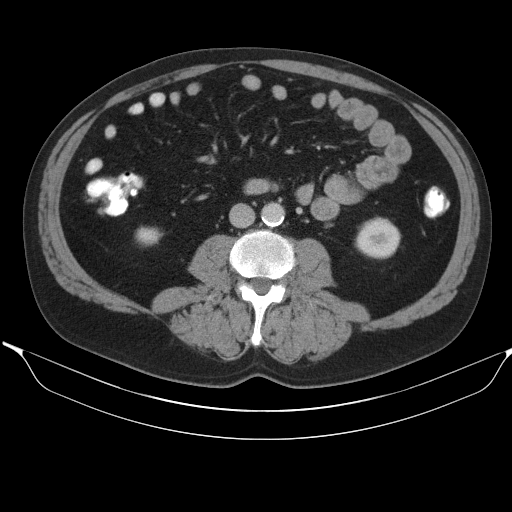
[im 69/106  soft-tissue]
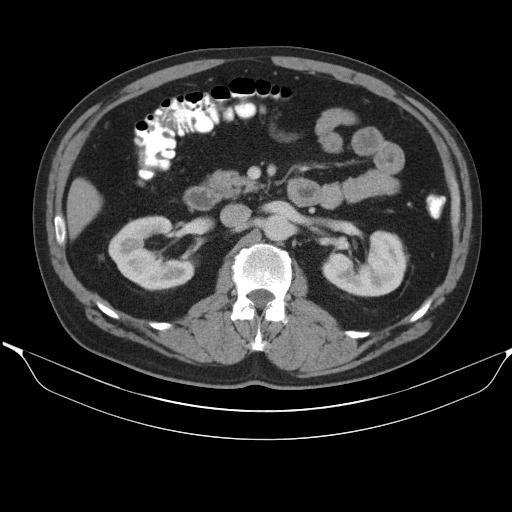
[im 69/106  bone]
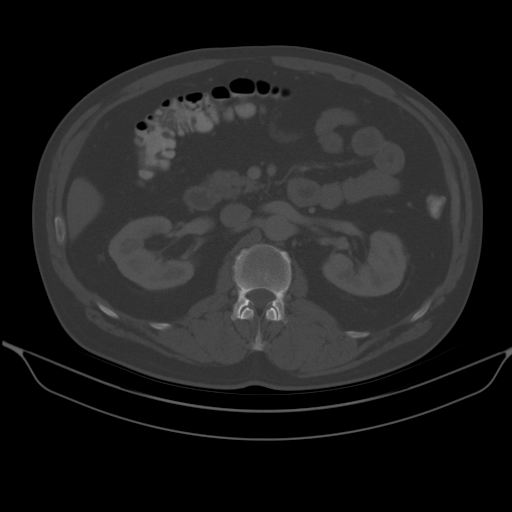
[im 74/106  soft-tissue]
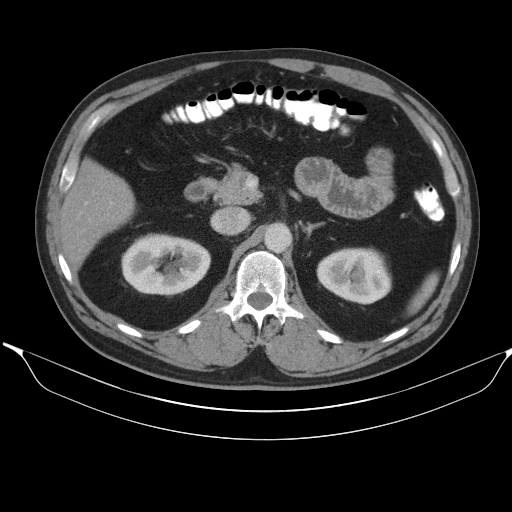
[im 85/106  soft-tissue]
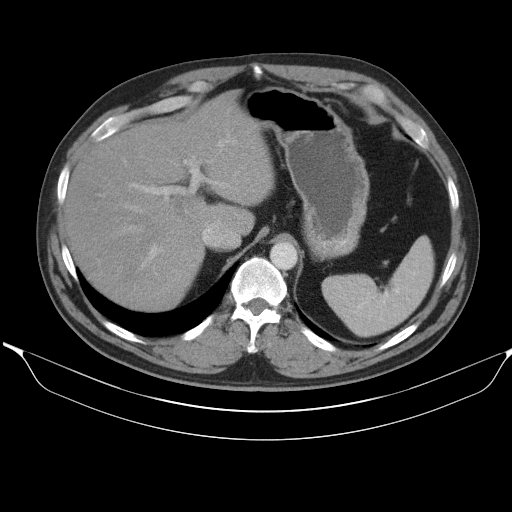
[im 85/106  lung]
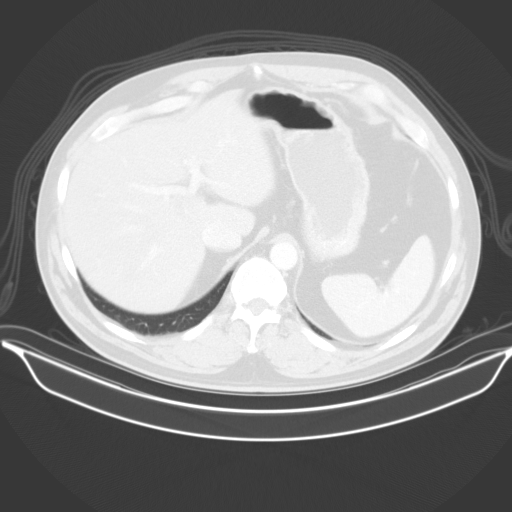
[im 90/106  soft-tissue]
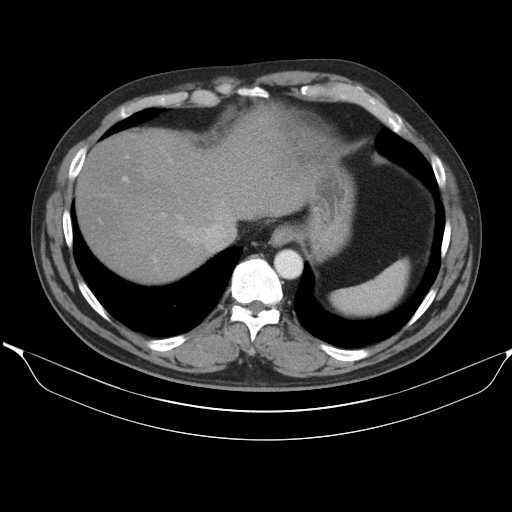
[im 90/106  lung]
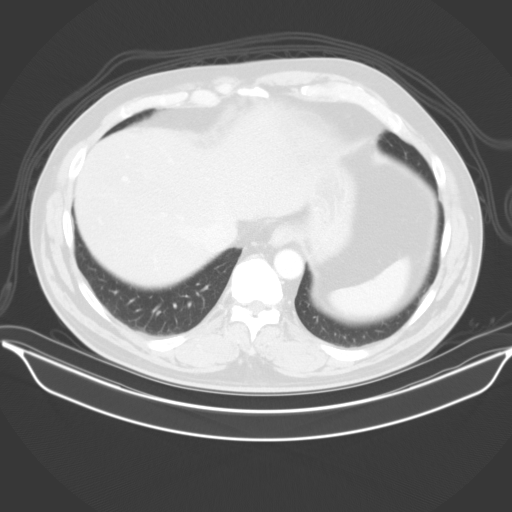
[im 95/106  lung]
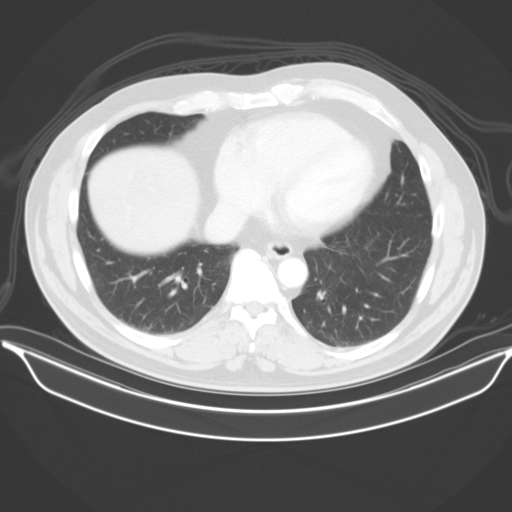
[im 100/106  soft-tissue]
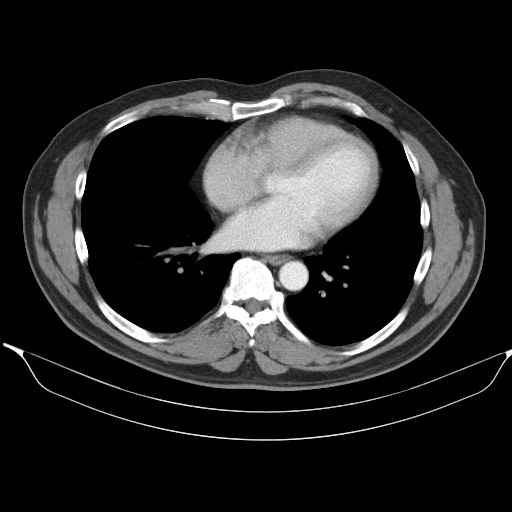
[im 100/106  lung]
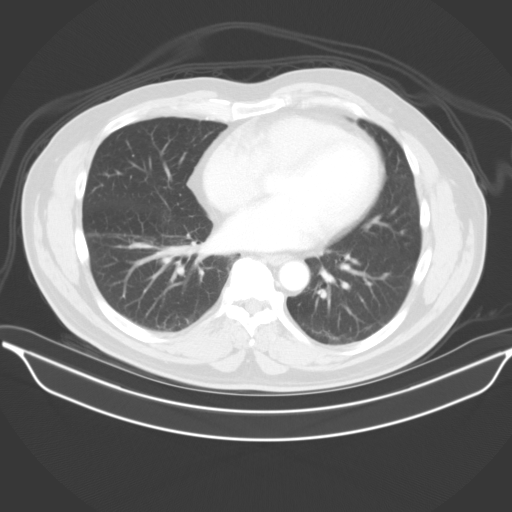

[14 of 32 positions shown; findings below may reference images not displayed]

FINDINGS: Lung bases are clear without infiltrate or effusion. Heart size is
normal.

Liver is homogeneous without focal lesion. Mild fatty infiltration
liver. Gallbladder and bile ducts normal. Pancreas and spleen normal

Kidneys are normal without renal mass or obstruction. No renal
calculi. Urinary bladder is normal. Mild prostate enlargement.

Negative for bowel obstruction. No bowel thickening. Terminal ileum
normal. Appendix normal.

Negative for mass or adenopathy. No free fluid. Negative for
abdominal aortic aneurysm.
IMPRESSION: No acute abnormality and no cause for right lower quadrant pain is
identified. Normal appendix.

## 2016-03-20 ENCOUNTER — Other Ambulatory Visit: Payer: Self-pay | Admitting: Internal Medicine

## 2016-04-23 ENCOUNTER — Other Ambulatory Visit: Payer: Self-pay | Admitting: *Deleted

## 2016-04-23 ENCOUNTER — Other Ambulatory Visit: Payer: Self-pay | Admitting: Internal Medicine

## 2016-04-23 MED ORDER — TESTOSTERONE CYPIONATE 200 MG/ML IM SOLN: INTRAMUSCULAR | 1 refills | Status: DC

## 2016-04-25 ENCOUNTER — Other Ambulatory Visit: Payer: Self-pay | Admitting: Physician Assistant

## 2016-04-25 DIAGNOSIS — J0100 Acute maxillary sinusitis, unspecified: Secondary | ICD-10-CM

## 2016-06-22 ENCOUNTER — Other Ambulatory Visit: Payer: Self-pay | Admitting: Physician Assistant

## 2016-06-22 DIAGNOSIS — J0100 Acute maxillary sinusitis, unspecified: Secondary | ICD-10-CM

## 2016-09-03 ENCOUNTER — Other Ambulatory Visit: Payer: Self-pay | Admitting: Internal Medicine

## 2016-09-11 ENCOUNTER — Other Ambulatory Visit: Payer: Self-pay | Admitting: Internal Medicine

## 2016-09-11 DIAGNOSIS — J0100 Acute maxillary sinusitis, unspecified: Secondary | ICD-10-CM

## 2016-09-18 ENCOUNTER — Encounter: Payer: Self-pay | Admitting: Internal Medicine

## 2016-09-18 ENCOUNTER — Other Ambulatory Visit: Payer: Self-pay | Admitting: Internal Medicine

## 2016-09-18 ENCOUNTER — Ambulatory Visit (INDEPENDENT_AMBULATORY_CARE_PROVIDER_SITE_OTHER): Payer: BLUE CROSS/BLUE SHIELD | Admitting: Internal Medicine

## 2016-09-18 VITALS — BP 126/82 | HR 60 | Temp 97.5°F | Resp 16 | Ht 69.5 in | Wt 189.6 lb

## 2016-09-18 DIAGNOSIS — R7303 Prediabetes: Secondary | ICD-10-CM | POA: Diagnosis not present

## 2016-09-18 DIAGNOSIS — I10 Essential (primary) hypertension: Secondary | ICD-10-CM | POA: Diagnosis not present

## 2016-09-18 DIAGNOSIS — Z79899 Other long term (current) drug therapy: Secondary | ICD-10-CM

## 2016-09-18 DIAGNOSIS — Z111 Encounter for screening for respiratory tuberculosis: Secondary | ICD-10-CM | POA: Diagnosis not present

## 2016-09-18 DIAGNOSIS — Z125 Encounter for screening for malignant neoplasm of prostate: Secondary | ICD-10-CM | POA: Diagnosis not present

## 2016-09-18 DIAGNOSIS — E559 Vitamin D deficiency, unspecified: Secondary | ICD-10-CM | POA: Diagnosis not present

## 2016-09-18 DIAGNOSIS — Z0000 Encounter for general adult medical examination without abnormal findings: Secondary | ICD-10-CM

## 2016-09-18 DIAGNOSIS — Z0001 Encounter for general adult medical examination with abnormal findings: Secondary | ICD-10-CM

## 2016-09-18 DIAGNOSIS — Z1212 Encounter for screening for malignant neoplasm of rectum: Secondary | ICD-10-CM

## 2016-09-18 DIAGNOSIS — E782 Mixed hyperlipidemia: Secondary | ICD-10-CM

## 2016-09-18 DIAGNOSIS — Z136 Encounter for screening for cardiovascular disorders: Secondary | ICD-10-CM | POA: Diagnosis not present

## 2016-09-18 DIAGNOSIS — R5383 Other fatigue: Secondary | ICD-10-CM | POA: Diagnosis not present

## 2016-09-18 DIAGNOSIS — E291 Testicular hypofunction: Secondary | ICD-10-CM

## 2016-09-18 LAB — URINALYSIS, ROUTINE W REFLEX MICROSCOPIC
BILIRUBIN URINE: NEGATIVE
Glucose, UA: NEGATIVE
Hgb urine dipstick: NEGATIVE
Ketones, ur: NEGATIVE
Leukocytes, UA: NEGATIVE
NITRITE: NEGATIVE
PROTEIN: NEGATIVE
SPECIFIC GRAVITY, URINE: 1.019 (ref 1.001–1.035)
pH: 5 (ref 5.0–8.0)

## 2016-09-18 LAB — CBC WITH DIFFERENTIAL/PLATELET
BASOS ABS: 96 {cells}/uL (ref 0–200)
Basophils Relative: 1 %
EOS PCT: 2 %
Eosinophils Absolute: 192 cells/uL (ref 15–500)
HCT: 47.8 % (ref 38.5–50.0)
Hemoglobin: 16 g/dL (ref 13.2–17.1)
LYMPHS ABS: 2400 {cells}/uL (ref 850–3900)
LYMPHS PCT: 25 %
MCH: 31.7 pg (ref 27.0–33.0)
MCHC: 33.5 g/dL (ref 32.0–36.0)
MCV: 94.7 fL (ref 80.0–100.0)
MONOS PCT: 8 %
MPV: 9.1 fL (ref 7.5–12.5)
Monocytes Absolute: 768 cells/uL (ref 200–950)
Neutro Abs: 6144 cells/uL (ref 1500–7800)
Neutrophils Relative %: 64 %
PLATELETS: 343 10*3/uL (ref 140–400)
RBC: 5.05 MIL/uL (ref 4.20–5.80)
RDW: 12.9 % (ref 11.0–15.0)
WBC: 9.6 10*3/uL (ref 3.8–10.8)

## 2016-09-18 LAB — TSH: TSH: 1.53 mIU/L (ref 0.40–4.50)

## 2016-09-18 MED ORDER — FENOFIBRATE MICRONIZED 134 MG PO CAPS: ORAL_CAPSULE | ORAL | 0 refills | Status: DC

## 2016-09-18 NOTE — Progress Notes (Signed)
[facility]   Unk Pinto, M.D.    [name], P.A.-C      Starlyn Skeans, P.A.-C  [facility]                [address] [place] [id number] Telephone [phone] Telefax [phone] Annual  Screening/Preventative Visit  & Comprehensive Evaluation & Examination     This very nice 56 y.o. [name] presents for a Screening/Preventative Visit & comprehensive evaluation and management of multiple medical co-morbidities.   Patient has been neglect in recommended regular f/u. Patient has been followed for HTN, Prediabetes, Hyperlipidemia, Testosterone Deficiency  and Vitamin D Deficiency.     HTN predates circa 1982 . Patient's BP has been controlled at home.  Today's BP is at goal -  126/82. Patient denies any cardiac symptoms as chest pain, palpitations, shortness of breath, dizziness or ankle swelling.     Patient's hyperlipidemia is controlled with diet and medications. Patient denies myalgias or other medication SE's. Last lipids were at goal: Lab Results  Component Value Date   CHOL 151 08/08/2015   HDL 55 08/08/2015   LDLCALC 83 08/08/2015   TRIG 66 08/08/2015   CHOLHDL 2.7 08/08/2015      Patient has hx/o Testosterone Deficiency and has been on replacement w/D-Testosterone with improved sense of well-being and stamina. Patient also has prediabetes with A1c 6.1%  since 2012 and patient denies reactive hypoglycemic symptoms, visual blurring, diabetic polys or paresthesias. Last A1c was still not at goal: Lab Results  Component Value Date   HGBA1C 5.9 (H) 08/08/2015       Finally, patient has history of Vitamin D Deficiency  In 2008 of  "24"  and last vitamin D was at goal: Lab Results  Component Value Date   VD25OH 59 08/08/2015   Current Outpatient Prescriptions on File Prior to Visit  Medication Sig  . aspirin 81 MG  Take  daily.  Marland Kitchen atenolol  100 MG  TAKE 1 TAB DAILY  . VIT  D 5000 UNITS  Take by mouth daily.  . fenofibrate  134 MG  TAKE 1 CAP AT BEDTIME.  Marland Kitchen FLONASE nasal spray 2 SPRAYS BOTH NOSTRILS AT BEDTIME.  Marland Kitchen testosterone cypio 200 MG injec USE 1.5 MLS EVERY 4 WEEKS   Allergies  Allergen Reactions  . Viagra [Sildenafil Citrate]     palpitations   Past Medical History:  Diagnosis Date  . Hyperlipemia   . Hypertension   . Hypogonadism male   . Vitamin D deficiency    Health Maintenance  Topic Date Due  . Hepatitis C Screening  03-27-1961  . HIV Screening  05/27/1974  . INFLUENZA VACCINE  04/17/2016  . COLONOSCOPY  02/03/2018  . TETANUS/TDAP  06/17/2021   Immunization History  Administered Date(s) Administered  . PPD Test 08/08/2015, 09/18/2016  . Pneumococcal-Unspecified 10/12/1997  . Td 06/30/2001  . Tdap 06/18/2011   Past Surgical History:  Procedure Laterality Date  . ORIF FINGER FRACTURE  03/27/2012   Procedure: OPEN REDUCTION INTERNAL FIXATION (ORIF) METACARPAL (FINGER) FRACTURE;  Surgeon: Cammie Sickle., MD;  Location: [facility];  Service: Orthopedics;  Laterality: Left;  Left small proximal phalanx  . WISDOM TOOTH EXTRACTION     Family History  Problem Relation Age of Onset  . Diabetes Mother   . Gallstones Mother   . Hepatitis Father   .  Liver cancer Father   . Breast cancer Sister   . Gallstones Brother   . Colon cancer Neg Hx    Social History   Social History  . Marital status: Married    Spouse name: N/A  . Number of children: 2  . Years of education: N/A   Occupational History  . assembly    Social History Main Topics  . Smoking status: Never Smoker  . Smokeless tobacco: Never Used  . Alcohol use 4.8 oz/week    8 Standard drinks or equivalent per week     Comment: social  . Drug use: No  . Sexual activity: Not on file    ROS Constitutional: Denies fever, chills, weight loss/gain, headaches, insomnia,  night sweats or change in appetite. Does c/o fatigue. Eyes: Denies redness,  blurred vision, diplopia, discharge, itchy or watery eyes.  ENT: Denies discharge, congestion, post nasal drip, epistaxis, sore throat, earache, hearing loss, dental pain, Tinnitus, Vertigo, Sinus pain or snoring.  Cardio: Denies chest pain, palpitations, irregular heartbeat, syncope, dyspnea, diaphoresis, orthopnea, PND, claudication or edema Respiratory: denies cough, dyspnea, DOE, pleurisy, hoarseness, laryngitis or wheezing.  Gastrointestinal: Denies dysphagia, heartburn, reflux, water brash, pain, cramps, nausea, vomiting, bloating, diarrhea, constipation, hematemesis, melena, hematochezia, jaundice or hemorrhoids Genitourinary: Denies dysuria, frequency, urgency, nocturia, hesitancy, discharge, hematuria or flank pain Musculoskeletal: Denies arthralgia, myalgia, stiffness, Jt. Swelling, pain, limp or strain/sprain. Denies Falls. Skin: Denies puritis, rash, hives, warts, acne, eczema or change in skin lesion Neuro: No weakness, tremor, incoordination, spasms, paresthesia or pain Psychiatric: Denies confusion, memory loss or sensory loss. Denies Depression. Endocrine: Denies change in weight, skin, hair change, nocturia, and paresthesia, diabetic polys, visual blurring or hyper / hypo glycemic episodes.  Heme/Lymph: No excessive bleeding, bruising or enlarged lymph nodes.  Physical Exam  BP 126/82   Pulse 60   Temp 97.5 F (36.4 C)   Resp 16   Ht 5' 9.5" (1.765 m)   Wt 189 lb 9.6 oz (86 kg)   BMI 27.60 kg/m   General Appearance: Well nourished, in no apparent distress.  Eyes: PERRLA, EOMs, conjunctiva no swelling or erythema, normal fundi and vessels. Sinuses: No frontal/maxillary tenderness ENT/Mouth: EACs patent / TMs  nl. Nares clear without erythema, swelling, mucoid exudates. Oral hygiene is good. No erythema, swelling, or exudate. Tongue normal, non-obstructing. Tonsils not swollen or erythematous. Hearing normal.  Neck: Supple, thyroid normal. No bruits, nodes or  JVD. Respiratory: Respiratory effort normal.  BS equal and clear bilateral without rales, rhonci, wheezing or stridor. Cardio: Heart sounds are normal with regular rate and rhythm and no murmurs, rubs or gallops. Peripheral pulses are normal and equal bilaterally without edema. No aortic or femoral bruits. Chest: symmetric with normal excursions and percussion.  Abdomen: Soft, with Nl bowel sounds. Nontender, no guarding, rebound, hernias, masses, or organomegaly.  Lymphatics: Non tender without lymphadenopathy.  Genitourinary: No hernias.Testes nl. DRE - prostate nl for age - smooth & firm w/o nodules. Musculoskeletal: Full ROM all peripheral extremities, joint stability, 5/5 strength, and normal gait. Skin: Warm and dry without rashes, lesions, cyanosis, clubbing or  ecchymosis.  Neuro: Cranial nerves intact, reflexes equal bilaterally. Normal muscle tone, no cerebellar symptoms. Sensation intact.  Pysch: Alert and oriented X 3 with normal affect, insight and judgment appropriate.   Assessment and Plan  1. Annual Preventative/Screening Exam    2. Essential hypertension  - CBC with Differential/Platelet - BASIC METABOLIC PANEL WITH GFR - Magnesium - TSH  3. Hyperlipidemia  - fenofibrate  micronized (LOFIBRA) 134 MG capsule; TAKE 1 CAP AT BEDTIME.  Disp: 90 capds; Refill:  - Hepatic function panel - Magnesium - Lipid panel  4. Prediabetes  - Lipid panel - Hemoglobin A1c - Insulin, random  5. Vitamin D deficiency  - VITAMIN D 25 Hydroxy   6. Testosterone Deficiency   7. Screening for rectal cancer   8. Prostate cancer screening   9. Screening examination for pulmonary tuberculosis  - PPD  10. Screening for ischemic heart disease   11. Screening for AAA (aortic abdominal aneurysm)   12. Fatigue, unspecified type  - Hepatic function panel  13. Medication management  - Hepatic function panel - Urinalysis, Routine w reflex microscopic        Continue  prudent diet as discussed, weight control, BP monitoring, regular exercise, and medications as discussed.  Discussed med effects and SE's. Routine screening labs and tests as requested with regular follow-up as recommended. Over 40 minutes of exam, counseling, chart review and high complex critical decision making was performed

## 2016-09-18 NOTE — Patient Instructions (Signed)

## 2016-09-19 LAB — LIPID PANEL
CHOL/HDL RATIO: 2.7 ratio (ref <5.0)
CHOLESTEROL: 175 mg/dL (ref <200)
HDL: 66 mg/dL (ref >40)
LDL Cholesterol: 94 mg/dL (ref <100)
TRIGLYCERIDES: 75 mg/dL (ref <150)
VLDL: 15 mg/dL (ref <30)

## 2016-09-19 LAB — BASIC METABOLIC PANEL WITH GFR
BUN: 17 mg/dL (ref 7–25)
CALCIUM: 10.1 mg/dL (ref 8.6–10.3)
CO2: 24 mmol/L (ref 20–31)
CREATININE: 1.08 mg/dL (ref 0.70–1.33)
Chloride: 104 mmol/L (ref 98–110)
GFR, EST AFRICAN AMERICAN: 88 mL/min (ref >=60)
GFR, EST NON AFRICAN AMERICAN: 76 mL/min (ref >=60)
Glucose, Bld: 83 mg/dL (ref 65–99)
Potassium: 4.5 mmol/L (ref 3.5–5.3)
SODIUM: 142 mmol/L (ref 135–146)

## 2016-09-19 LAB — HEPATIC FUNCTION PANEL
ALT: 21 U/L (ref 9–46)
AST: 21 U/L (ref 10–35)
Albumin: 4.6 g/dL (ref 3.6–5.1)
Alkaline Phosphatase: 41 U/L (ref 40–115)
BILIRUBIN DIRECT: 0.2 mg/dL (ref <=0.2)
BILIRUBIN INDIRECT: 0.6 mg/dL (ref 0.2–1.2)
BILIRUBIN TOTAL: 0.8 mg/dL (ref 0.2–1.2)
Total Protein: 7.2 g/dL (ref 6.1–8.1)

## 2016-09-19 LAB — VITAMIN D 25 HYDROXY (VIT D DEFICIENCY, FRACTURES): Vit D, 25-Hydroxy: 67 ng/mL (ref 30–100)

## 2016-09-19 LAB — HEMOGLOBIN A1C
HEMOGLOBIN A1C: 5.5 % (ref <5.7)
Mean Plasma Glucose: 111 mg/dL

## 2016-09-19 LAB — MAGNESIUM: MAGNESIUM: 1.8 mg/dL (ref 1.5–2.5)

## 2016-09-19 LAB — INSULIN, RANDOM: Insulin: 5.6 u[IU]/mL (ref 2.0–19.6)

## 2016-09-20 ENCOUNTER — Encounter: Payer: Self-pay | Admitting: *Deleted

## 2016-09-20 LAB — TB SKIN TEST
INDURATION: 0 mm
TB Skin Test: NEGATIVE

## 2016-09-21 NOTE — Addendum Note (Signed)
Addended by: Demarian Epps A on: 09/21/2016 01:05 PM   Modules accepted: Orders

## 2016-11-05 ENCOUNTER — Telehealth: Payer: Self-pay | Admitting: *Deleted

## 2016-11-05 MED ORDER — TRAZODONE HCL 150 MG PO TABS: ORAL_TABLET | ORAL | 0 refills | Status: DC

## 2016-11-05 NOTE — Telephone Encounter (Signed)
Patient called and requested an RX to help with stress and sleep. Per Dr Melford Aase, it is OK to send in an RX for Trazodone 150 mg to [facility].

## 2016-11-24 ENCOUNTER — Other Ambulatory Visit: Payer: Self-pay | Admitting: Internal Medicine

## 2016-12-02 ENCOUNTER — Other Ambulatory Visit: Payer: Self-pay | Admitting: Internal Medicine

## 2016-12-18 ENCOUNTER — Ambulatory Visit: Payer: Self-pay | Admitting: Internal Medicine

## 2016-12-26 ENCOUNTER — Other Ambulatory Visit: Payer: Self-pay | Admitting: Internal Medicine

## 2017-03-26 ENCOUNTER — Ambulatory Visit: Payer: Self-pay | Admitting: Internal Medicine

## 2017-04-10 NOTE — Progress Notes (Signed)
Assessment and Plan:   Essential hypertension - continue medications, DASH diet, exercise and monitor at home. Call if greater than 130/80.  -     CBC with Differential/Platelet -     BASIC METABOLIC PANEL WITH GFR -     Hepatic function panel -     TSH  Testosterone Deficiency continue  Hyperlipidemia Can stop the fenofibrate -     Lipid panel  Medication management -     Magnesium   Continue diet and meds as discussed. Further disposition pending results of labs.  HPI 56 y.o. male  presents for 3 month follow up with hypertension, hyperlipidemia, prediabetes and vitamin D.   His blood pressure has been controlled at home, today their BP is BP: 122/80.   He does workout. He denies chest pain, shortness of breath, dizziness.   He is on cholesterol medication and denies myalgias. His cholesterol is at goal. The cholesterol last visit was:   Lab Results  Component Value Date   CHOL 175 09/18/2016   HDL 66 09/18/2016   LDLCALC 94 09/18/2016   TRIG 75 09/18/2016   CHOLHDL 2.7 09/18/2016    He has been working on diet and exercise for prediabetes, and denies foot ulcerations, hyperglycemia, hypoglycemia , increased appetite, nausea, paresthesia of the feet, polydipsia, polyuria, visual disturbances, vomiting and weight loss. Last A1C in the office was:  Lab Results  Component Value Date   HGBA1C 5.5 09/18/2016   Patient is on Vitamin D supplement.  Lab Results  Component Value Date   VD25OH 67 09/18/2016     He has a history of testosterone deficiency and is on testosterone replacement. He states that the testosterone helps with his energy, libido, muscle mass. Lab Results  Component Value Date   TESTOSTERONE 329 08/08/2015   BMI is Body mass index is 25.85 kg/m., he is working on diet and exercise and has been trying.  Wt Readings from Last 3 Encounters:  04/11/17 177 lb 9.6 oz (80.6 kg)  09/18/16 189 lb 9.6 oz (86 kg)  02/14/16 197 lb 6.4 oz (89.5 kg)     Current Medications:  Current Outpatient Prescriptions on File Prior to Visit  Medication Sig Dispense Refill  . aspirin 81 MG tablet Take 81 mg by mouth daily.    Marland Kitchen atenolol (TENORMIN) 100 MG tablet TAKE 1 TABLET BY MOUTH DAILY FOR BLOOD PRESSURE 90 tablet 1  . Cholecalciferol (VITAMIN D3) 5000 UNITS CAPS Take by mouth daily.    . fenofibrate micronized (LOFIBRA) 134 MG capsule TAKE 1 CAPSULE (134 MG TOTAL) BY MOUTH AT BEDTIME. 90 capsule 0  . testosterone cypionate (DEPOTESTOSTERONE CYPIONATE) 200 MG/ML injection INJECT 1 AND 1/2 MILLILITERS EVERY 4 WEEKS 10 mL 1   No current facility-administered medications on file prior to visit.     Medical History:  Past Medical History:  Diagnosis Date  . Hyperlipemia   . Hypertension   . Hypogonadism male   . Vitamin D deficiency     Allergies:  Allergies  Allergen Reactions  . Viagra [Sildenafil Citrate]     palpitations     Review of Systems:  Review of Systems  Constitutional: Negative for chills, fever and malaise/fatigue.  HENT: Negative for congestion, ear pain, nosebleeds and sore throat.   Eyes: Negative for blurred vision, double vision and discharge.  Respiratory: Negative for cough, shortness of breath and wheezing.   Cardiovascular: Negative for chest pain, palpitations and leg swelling.  Gastrointestinal: Negative for abdominal pain, blood in  stool, constipation, diarrhea, heartburn and melena.  Genitourinary: Negative.   Skin: Negative.   Neurological: Negative for dizziness, sensory change, loss of consciousness and headaches.  Psychiatric/Behavioral: Negative for depression. The patient is not nervous/anxious and does not have insomnia.     Family history- Review and unchanged  Social history- Review and unchanged  Physical Exam: BP 122/80   Pulse 67   Temp (!) 97.4 F (36.3 C)   Resp 16   Ht 5' 9.5" (1.765 m)   Wt 177 lb 9.6 oz (80.6 kg)   SpO2 98%   BMI 25.85 kg/m  Wt Readings from Last 3  Encounters:  04/11/17 177 lb 9.6 oz (80.6 kg)  09/18/16 189 lb 9.6 oz (86 kg)  02/14/16 197 lb 6.4 oz (89.5 kg)    General Appearance: Well nourished well developed, in no apparent distress. Eyes: PERRLA, EOMs, conjunctiva no swelling or erythema ENT/Mouth: Ear canals normal without obstruction, swelling, erythma, discharge.  TMs normal bilaterally.  Oropharynx moist, clear, without exudate, or postoropharyngeal swelling. Neck: Supple, thyroid normal,no cervical adenopathy  Respiratory: Respiratory effort normal, Breath sounds clear A&P without rhonchi, wheeze, or rale.  No retractions, no accessory usage. Cardio: RRR with no MRGs. Brisk peripheral pulses without edema.  Abdomen: Soft, + BS,  Non tender, no guarding, rebound, hernias, masses. Musculoskeletal: Full ROM, 5/5 strength, Normal gait Skin: Warm, dry without rashes, lesions, ecchymosis.  Neuro: Awake and oriented X 3, Cranial nerves intact. Normal muscle tone, no cerebellar symptoms. Psych: Normal affect, Insight and Judgment appropriate.    Vicie Mutters, PA-C 3:55 PM [facility]

## 2017-04-11 ENCOUNTER — Ambulatory Visit (INDEPENDENT_AMBULATORY_CARE_PROVIDER_SITE_OTHER): Payer: BLUE CROSS/BLUE SHIELD | Admitting: Physician Assistant

## 2017-04-11 ENCOUNTER — Encounter: Payer: Self-pay | Admitting: Physician Assistant

## 2017-04-11 VITALS — BP 122/80 | HR 67 | Temp 97.4°F | Resp 16 | Ht 69.5 in | Wt 177.6 lb

## 2017-04-11 DIAGNOSIS — R7309 Other abnormal glucose: Secondary | ICD-10-CM

## 2017-04-11 DIAGNOSIS — Z79899 Other long term (current) drug therapy: Secondary | ICD-10-CM | POA: Diagnosis not present

## 2017-04-11 DIAGNOSIS — I10 Essential (primary) hypertension: Secondary | ICD-10-CM

## 2017-04-11 DIAGNOSIS — E782 Mixed hyperlipidemia: Secondary | ICD-10-CM | POA: Diagnosis not present

## 2017-04-11 DIAGNOSIS — E291 Testicular hypofunction: Secondary | ICD-10-CM | POA: Diagnosis not present

## 2017-04-11 LAB — LIPID PANEL
CHOLESTEROL: 143 mg/dL (ref <200)
HDL: 63 mg/dL (ref >40)
LDL Cholesterol: 72 mg/dL (ref <100)
TRIGLYCERIDES: 40 mg/dL (ref <150)
Total CHOL/HDL Ratio: 2.3 Ratio (ref <5.0)
VLDL: 8 mg/dL (ref <30)

## 2017-04-11 LAB — CBC WITH DIFFERENTIAL/PLATELET
Basophils Absolute: 86 cells/uL (ref 0–200)
Basophils Relative: 1 %
EOS PCT: 2 %
Eosinophils Absolute: 172 cells/uL (ref 15–500)
HEMATOCRIT: 43.1 % (ref 38.5–50.0)
Hemoglobin: 14.5 g/dL (ref 13.2–17.1)
LYMPHS PCT: 26 %
Lymphs Abs: 2236 cells/uL (ref 850–3900)
MCH: 32 pg (ref 27.0–33.0)
MCHC: 33.6 g/dL (ref 32.0–36.0)
MCV: 95.1 fL (ref 80.0–100.0)
MPV: 9.1 fL (ref 7.5–12.5)
Monocytes Absolute: 860 cells/uL (ref 200–950)
Monocytes Relative: 10 %
NEUTROS PCT: 61 %
Neutro Abs: 5246 cells/uL (ref 1500–7800)
PLATELETS: 304 10*3/uL (ref 140–400)
RBC: 4.53 MIL/uL (ref 4.20–5.80)
RDW: 13 % (ref 11.0–15.0)
WBC: 8.6 10*3/uL (ref 3.8–10.8)

## 2017-04-11 LAB — BASIC METABOLIC PANEL WITH GFR
BUN: 20 mg/dL (ref 7–25)
CALCIUM: 9.3 mg/dL (ref 8.6–10.3)
CO2: 24 mmol/L (ref 20–31)
Chloride: 104 mmol/L (ref 98–110)
Creat: 1.18 mg/dL (ref 0.70–1.33)
GFR, EST NON AFRICAN AMERICAN: 68 mL/min (ref >=60)
GFR, Est African American: 79 mL/min (ref >=60)
GLUCOSE: 74 mg/dL (ref 65–99)
Potassium: 4 mmol/L (ref 3.5–5.3)
SODIUM: 141 mmol/L (ref 135–146)

## 2017-04-11 LAB — HEPATIC FUNCTION PANEL
ALBUMIN: 4.1 g/dL (ref 3.6–5.1)
ALT: 21 U/L (ref 9–46)
AST: 20 U/L (ref 10–35)
Alkaline Phosphatase: 38 U/L — ABNORMAL LOW (ref 40–115)
BILIRUBIN INDIRECT: 0.6 mg/dL (ref 0.2–1.2)
BILIRUBIN TOTAL: 0.8 mg/dL (ref 0.2–1.2)
Bilirubin, Direct: 0.2 mg/dL (ref <=0.2)
TOTAL PROTEIN: 6.3 g/dL (ref 6.1–8.1)

## 2017-04-11 NOTE — Patient Instructions (Addendum)
Can stop the fenofibrate, will monitor trigs next visit.   Can cut back on the atenolol to 50mg  or 1/2 pill a day.    Monitor your blood pressure at home. Go to the ER if any CP, SOB, nausea, dizziness, severe HA, changes vision/speech  Goal BP:  For patients younger than 60: Goal BP < 140/90. For patients 60 and older: Goal BP < 150/90. For patients with diabetes: Goal BP < 140/90. Your most recent BP: BP: 122/80   Take your medications faithfully as instructed. Maintain a healthy weight. Get at least 150 minutes of aerobic exercise per week. Minimize salt intake. Minimize alcohol intake  DASH Eating Plan DASH stands for "Dietary Approaches to Stop Hypertension." The DASH eating plan is a healthy eating plan that has been shown to reduce high blood pressure (hypertension). Additional health benefits may include reducing the risk of type 2 diabetes mellitus, heart disease, and stroke. The DASH eating plan may also help with weight loss. WHAT DO I NEED TO KNOW ABOUT THE DASH EATING PLAN? For the DASH eating plan, you will follow these general guidelines:  Choose foods with a percent daily value for sodium of less than 5% (as listed on the food label).  Use salt-free seasonings or herbs instead of table salt or sea salt.  Check with your health care provider or pharmacist before using salt substitutes.  Eat lower-sodium products, often labeled as "lower sodium" or "no salt added."  Eat fresh foods.  Eat more vegetables, fruits, and low-fat dairy products.  Choose whole grains. Look for the word "whole" as the first word in the ingredient list.  Choose fish and skinless chicken or [place] more often than red meat. Limit fish, poultry, and meat to 6 oz (170 g) each day.  Limit sweets, desserts, sugars, and sugary drinks.  Choose heart-healthy fats.  Limit cheese to 1 oz (28 g) per day.  Eat more home-cooked food and less restaurant, buffet, and fast food.  Limit fried  foods.  Cook foods using methods other than frying.  Limit canned vegetables. If you do use them, rinse them well to decrease the sodium.  When eating at a restaurant, ask that your food be prepared with less salt, or no salt if possible. WHAT FOODS CAN I EAT? Seek help from a dietitian for individual calorie needs. Grains Whole grain or whole wheat bread. Brown rice. Whole grain or whole wheat pasta. Quinoa, bulgur, and whole grain cereals. Low-sodium cereals. Corn or whole wheat flour tortillas. Whole grain cornbread. Whole grain crackers. Low-sodium crackers. Vegetables Fresh or frozen vegetables (raw, steamed, roasted, or grilled). Low-sodium or reduced-sodium tomato and vegetable juices. Low-sodium or reduced-sodium tomato sauce and paste. Low-sodium or reduced-sodium canned vegetables.  Fruits All fresh, canned (in natural juice), or frozen fruits. Meat and Other Protein Products Ground beef (85% or leaner), grass-fed beef, or beef trimmed of fat. Skinless chicken or [place]. Ground chicken or [place]. Pork trimmed of fat. All fish and seafood. Eggs. Dried beans, peas, or lentils. Unsalted nuts and seeds. Unsalted canned beans. Dairy Low-fat dairy products, such as skim or 1% milk, 2% or reduced-fat cheeses, low-fat ricotta or cottage cheese, or plain low-fat yogurt. Low-sodium or reduced-sodium cheeses. Fats and Oils Tub margarines without trans fats. Light or reduced-fat mayonnaise and salad dressings (reduced sodium). Avocado. Safflower, olive, or canola oils. Natural peanut or almond butter. Other Unsalted popcorn and pretzels. The items listed above may not be a complete list of recommended foods or beverages.  Contact your dietitian for more options. WHAT FOODS ARE NOT RECOMMENDED? Grains White bread. White pasta. White rice. Refined cornbread. Bagels and croissants. Crackers that contain trans fat. Vegetables Creamed or fried vegetables. Vegetables in a cheese sauce. Regular  canned vegetables. Regular canned tomato sauce and paste. Regular tomato and vegetable juices. Fruits Dried fruits. Canned fruit in light or heavy syrup. Fruit juice. Meat and Other Protein Products Fatty cuts of meat. Ribs, chicken wings, bacon, sausage, bologna, salami, chitterlings, fatback, hot dogs, bratwurst, and packaged luncheon meats. Salted nuts and seeds. Canned beans with salt. Dairy Whole or 2% milk, cream, half-and-half, and cream cheese. Whole-fat or sweetened yogurt. Full-fat cheeses or blue cheese. Nondairy creamers and whipped toppings. Processed cheese, cheese spreads, or cheese curds. Condiments Onion and garlic salt, seasoned salt, table salt, and sea salt. Canned and packaged gravies. Worcestershire sauce. Tartar sauce. Barbecue sauce. Teriyaki sauce. Soy sauce, including reduced sodium. Steak sauce. Fish sauce. Oyster sauce. Cocktail sauce. Horseradish. Ketchup and mustard. Meat flavorings and tenderizers. Bouillon cubes. Hot sauce. Tabasco sauce. Marinades. Taco seasonings. Relishes. Fats and Oils Butter, stick margarine, lard, shortening, ghee, and bacon fat. Coconut, palm kernel, or palm oils. Regular salad dressings. Other Pickles and olives. Salted popcorn and pretzels. The items listed above may not be a complete list of foods and beverages to avoid. Contact your dietitian for more information. WHERE CAN I FIND MORE INFORMATION? National Heart, Lung, and Blood Institute: travelstabloid.com Document Released: 08/23/2011 Document Revised: 01/18/2014 Document Reviewed: 07/08/2013 [facility] Patient Information 2015 [place], [place]. This information is not intended to replace advice given to you by your health care provider. Make sure you discuss any questions you have with your health care provider.  9 Ways to Naturally Increase Testosterone Levels  1.   Lose Weight If you're overweight, shedding the excess pounds may increase your  testosterone levels, according to research presented at the Endocrine Society's 2012 meeting. Overweight men are more likely to have low testosterone levels to begin with, so this is an important trick to increase your body's testosterone production when you need it most.  2.   High-Intensity Exercise like Peak Fitness  Short intense exercise has a proven positive effect on increasing testosterone levels and preventing its decline. That's unlike aerobics or prolonged moderate exercise, which have shown to have negative or no effect on testosterone levels. Having a whey protein meal after exercise can further enhance the satiety/testosterone-boosting impact (hunger hormones cause the opposite effect on your testosterone and libido). Here's a summary of what a typical high-intensity Peak Fitness routine might look like: " Warm up for three minutes  " Exercise as hard and fast as you can for 30 seconds. You should feel like you couldn't possibly go on another few seconds  " Recover at a slow to moderate pace for 90 seconds  " Repeat the high intensity exercise and recovery 7 more times .  3.   Consume Plenty of Zinc The mineral zinc is important for testosterone production, and supplementing your diet for as little as six weeks has been shown to cause a marked improvement in testosterone among men with low levels.1 Likewise, research has shown that restricting dietary sources of zinc leads to a significant decrease in testosterone, while zinc supplementation increases it2 -- and even protects men from exercised-induced reductions in testosterone levels.3 It's estimated that up to 36 percent of adults over the age of 60 may have lower than recommended zinc intakes; even when dietary supplements were added in,  an estimated 20-25 percent of older adults still had inadequate zinc intakes, according to a [place].4 Your diet is the best source of zinc; along with  protein-rich foods like meats and fish, other good dietary sources of zinc include raw milk, raw cheese, beans, and yogurt or kefir made from raw milk. It can be difficult to obtain enough dietary zinc if you're a vegetarian, and also for meat-eaters as well, largely because of conventional farming methods that rely heavily on chemical fertilizers and pesticides. These chemicals deplete the soil of nutrients ... nutrients like zinc that must be absorbed by plants in order to be passed on to you. In many cases, you may further deplete the nutrients in your food by the way you prepare it. For most food, cooking it will drastically reduce its levels of nutrients like zinc . particularly over-cooking, which many people do. If you decide to use a zinc supplement, stick to a dosage of less than 40 mg a day, as this is the recommended adult upper limit. Taking too much zinc can interfere with your body's ability to absorb other minerals, especially copper, and may cause nausea as a side effect.  4.   Strength Training In addition to Peak Fitness, strength training is also known to boost testosterone levels, provided you are doing so intensely enough. When strength training to boost testosterone, you'll want to increase the weight and lower your number of reps, and then focus on exercises that work a large number of muscles, such as dead lifts or squats.  You can "turbo-charge" your weight training by going slower. By slowing down your movement, you're actually turning it into a high-intensity exercise. Super Slow movement allows your muscle, at the microscopic level, to access the maximum number of cross-bridges between the protein filaments that produce movement in the muscle.   5.   Optimize Your Vitamin D Levels Vitamin D, a steroid hormone, is essential for the healthy development of the nucleus of the sperm cell, and helps maintain semen quality and sperm count. Vitamin D also increases levels of  testosterone, which may boost libido. In one study, overweight men who were given vitamin D supplements had a significant increase in testosterone levels after one year.5   6.   Reduce Stress When you're under a lot of stress, your body releases high levels of the stress hormone cortisol. This hormone actually blocks the effects of testosterone,6 presumably because, from a biological standpoint, testosterone-associated behaviors (mating, competing, aggression) may have lowered your chances of survival in an emergency (hence, the "fight or flight" response is dominant, courtesy of cortisol).  7.   Limit or Eliminate Sugar from Your Diet Testosterone levels decrease after you eat sugar, which is likely because the sugar leads to a high insulin level, another factor leading to low testosterone.7 Based on USDA estimates, the average American consumes 12 teaspoons of sugar a day, which equates to about TWO TONS of sugar during a lifetime.  8.   Eat Healthy Fats By healthy, this means not only mon- and polyunsaturated fats, like that found in avocadoes and nuts, but also saturated, as these are essential for building testosterone. Research shows that a diet with less than 40 percent of energy as fat (and that mainly from animal sources, i.e. saturated) lead to a decrease in testosterone levels.8 My personal diet is about 60-70 percent healthy fat, and other experts agree that the ideal diet includes somewhere between 50-70 percent fat.  It's important to understand that your body requires saturated fats from animal and vegetable sources (such as meat, dairy, certain oils, and tropical plants like coconut) for optimal functioning, and if you neglect this important food group in favor of sugar, grains and other starchy carbs, your health and weight are almost guaranteed to suffer. Examples of healthy fats you can eat more of to give your testosterone levels a boost include: Olives and Olive oil  Coconuts and  coconut oil Butter made from raw grass-fed organic milk Raw nuts, such as, almonds or pecans Organic pastured egg yolks Avocados Grass-fed meats Palm oil Unheated organic nut oils   9.   Boost Your Intake of Branch Chain Amino Acids (BCAA) from Foods Like Strawberry suggests that BCAAs result in higher testosterone levels, particularly when taken along with resistance training.9 While BCAAs are available in supplement form, you'll find the highest concentrations of BCAAs like leucine in dairy products - especially quality cheeses and whey protein. Even when getting leucine from your natural food supply, it's often wasted or used as a building block instead of an anabolic agent. So to create the correct anabolic environment, you need to boost leucine consumption way beyond mere maintenance levels. That said, keep in mind that using leucine as a free form amino acid can be highly counterproductive as when free form amino acids are artificially administrated, they rapidly enter your circulation while disrupting insulin function, and impairing your body's glycemic control. Food-based leucine is really the ideal form that can benefit your muscles without side effects.

## 2017-04-12 LAB — TSH: TSH: 1.25 mIU/L (ref 0.40–4.50)

## 2017-04-12 LAB — MAGNESIUM: Magnesium: 1.9 mg/dL (ref 1.5–2.5)

## 2017-04-12 NOTE — Progress Notes (Signed)
Pt aware of lab results & voiced understanding of those results.

## 2017-05-18 ENCOUNTER — Other Ambulatory Visit: Payer: Self-pay | Admitting: Internal Medicine

## 2017-06-24 ENCOUNTER — Other Ambulatory Visit: Payer: Self-pay | Admitting: Internal Medicine

## 2017-06-24 NOTE — Telephone Encounter (Signed)
Please call Alpraz  

## 2017-06-24 NOTE — Telephone Encounter (Signed)
TESTOSTERONE CALLED INTO PHARMACY ON OCT 8TH 2018 BY DD

## 2017-10-21 ENCOUNTER — Encounter: Payer: Self-pay | Admitting: Internal Medicine

## 2017-10-29 ENCOUNTER — Ambulatory Visit: Payer: BLUE CROSS/BLUE SHIELD | Admitting: Internal Medicine

## 2017-10-29 ENCOUNTER — Encounter: Payer: Self-pay | Admitting: Internal Medicine

## 2017-10-29 VITALS — BP 118/84 | HR 72 | Temp 97.7°F | Resp 16 | Ht 69.25 in | Wt 177.4 lb

## 2017-10-29 DIAGNOSIS — Z125 Encounter for screening for malignant neoplasm of prostate: Secondary | ICD-10-CM | POA: Diagnosis not present

## 2017-10-29 DIAGNOSIS — Z136 Encounter for screening for cardiovascular disorders: Secondary | ICD-10-CM

## 2017-10-29 DIAGNOSIS — Z79899 Other long term (current) drug therapy: Secondary | ICD-10-CM | POA: Diagnosis not present

## 2017-10-29 DIAGNOSIS — R7303 Prediabetes: Secondary | ICD-10-CM

## 2017-10-29 DIAGNOSIS — R5383 Other fatigue: Secondary | ICD-10-CM

## 2017-10-29 DIAGNOSIS — I10 Essential (primary) hypertension: Secondary | ICD-10-CM

## 2017-10-29 DIAGNOSIS — E559 Vitamin D deficiency, unspecified: Secondary | ICD-10-CM

## 2017-10-29 DIAGNOSIS — Z1211 Encounter for screening for malignant neoplasm of colon: Secondary | ICD-10-CM

## 2017-10-29 DIAGNOSIS — E291 Testicular hypofunction: Secondary | ICD-10-CM

## 2017-10-29 DIAGNOSIS — E782 Mixed hyperlipidemia: Secondary | ICD-10-CM

## 2017-10-29 DIAGNOSIS — Z0000 Encounter for general adult medical examination without abnormal findings: Secondary | ICD-10-CM | POA: Diagnosis not present

## 2017-10-29 DIAGNOSIS — Z111 Encounter for screening for respiratory tuberculosis: Secondary | ICD-10-CM

## 2017-10-29 DIAGNOSIS — Z0001 Encounter for general adult medical examination with abnormal findings: Secondary | ICD-10-CM

## 2017-10-29 DIAGNOSIS — Z1212 Encounter for screening for malignant neoplasm of rectum: Secondary | ICD-10-CM

## 2017-10-29 NOTE — Patient Instructions (Signed)

## 2017-10-29 NOTE — Progress Notes (Signed)
[facility]   Unk Pinto, M.D.     [name], P.A.-C Liane Comber, [place]                [address] [place], [place] [address] Telephone [phone] Telefax [phone] Annual  Screening/Preventative Visit  & Comprehensive Evaluation & Examination     This very nice 57 y.o. [name] presents for a Screening/Preventative Visit & comprehensive evaluation and management of multiple medical co-morbidities.  Patient has been followed for HTN, Prediabetes, Hyperlipidemia, Testosterone Deficiency and Vitamin D Deficiency.     HTN predates since 62. Patient's BP has been controlled at home.  Today's BP is at goal - 118/84. Patient denies any cardiac symptoms as chest pain, palpitations, shortness of breath, dizziness or ankle swelling.     Patient's hyperlipidemia is controlled with diet and medications. Patient denies myalgias or other medication SE's. Last lipids were at goal: Lab Results  Component Value Date   CHOL 143 04/11/2017   HDL 63 04/11/2017   LDLCALC 72 04/11/2017   TRIG 40 04/11/2017   CHOLHDL 2.3 04/11/2017      Patient has prediabetes (A1c 6.1%/2012) and patient denies reactive hypoglycemic symptoms, visual blurring, diabetic polys or paresthesias. Last A1c was  Normal & at goal:  Lab Results  Component Value Date   HGBA1C 5.5 09/18/2016       Patient has hx/o Low Testosterone and has been on replacement by Depo-Testosterone with improved stamina & sense of well being.      Finally, patient has history of Vitamin D Deficiency ("24"/2008) and last vitamin D was at goal: Lab Results  Component Value Date   VD25OH 67 09/18/2016   Current Outpatient Medications on File Prior to Visit  Medication Sig  . aspirin 81 MG tablet Take 81 mg by mouth daily.  Marland Kitchen atenolol (TENORMIN) 100 MG tablet TAKE 1 TABLET BY MOUTH DAILY FOR BLOOD PRESSURE  . B-D 3CC LUER-LOK SYR 21GX1"  21G X 1" 3 ML MISC USE AS DIRECTED  . Cholecalciferol (VITAMIN D3) 5000 UNITS CAPS Take by mouth daily.  Marland Kitchen testosterone cypionate (DEPOTESTOSTERONE CYPIONATE) 200 MG/ML injection INJECT 1.5 ML EVERY 4 WEEKS  . fenofibrate micronized (LOFIBRA) 134 MG capsule TAKE 1 CAPSULE (134 MG TOTAL) BY MOUTH AT BEDTIME. (Patient not taking: Reported on 10/29/2017)   No current facility-administered medications on file prior to visit.    Allergies  Allergen Reactions  . Viagra [Sildenafil Citrate]     palpitations   Past Medical History:  Diagnosis Date  . Hyperlipemia   . Hypertension   . Hypogonadism male   . Vitamin D deficiency    Health Maintenance  Topic Date Due  . Hepatitis C Screening  Mar 29, 1961  . HIV Screening  05/27/1974  . INFLUENZA VACCINE  04/17/2017  . COLONOSCOPY  02/03/2018  . TETANUS/TDAP  06/17/2021   Immunization History  Administered Date(s) Administered  . PPD Test 08/08/2015, 09/18/2016, 10/29/2017  . Pneumococcal-Unspecified 10/12/1997  . Td 06/30/2001  . Tdap 06/18/2011   Last Colon -  Past Surgical History:  Procedure Laterality Date  . ORIF FINGER FRACTURE  03/27/2012   Procedure: OPEN REDUCTION INTERNAL FIXATION (ORIF) METACARPAL (FINGER) FRACTURE;  Surgeon: Cammie Sickle., MD;  Location: [facility];  Service: Orthopedics;  Laterality: Left;  Left small proximal phalanx  . WISDOM TOOTH EXTRACTION  Family History  Problem Relation Age of Onset  . Diabetes Mother   . Gallstones Mother   . Hepatitis Father   . Liver cancer Father   . Breast cancer Sister   . Gallstones Brother   . Colon cancer Neg Hx    Social History   Socioeconomic History  . Marital status: Married    Spouse name: Jan  . Number of children: 2  Occupational History  . 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 Smoking status: Never Smoker  . Smokeless tobacco: Never Used  Substance and Sexual Activity  . Alcohol use: Yes     Alcohol/week: 4.8 oz    Types: 8 Standard drinks or equivalent per week    Comment: social  . Drug use: No  . Sexual activity: Active    ROS Constitutional: Denies fever, chills, weight loss/gain, headaches, insomnia,  night sweats or change in appetite. Does c/o fatigue. Eyes: Denies redness, blurred vision, diplopia, discharge, itchy or watery eyes.  ENT: Denies discharge, congestion, post nasal drip, epistaxis, sore throat, earache, hearing loss, dental pain, Tinnitus, Vertigo, Sinus pain or snoring.  Cardio: Denies chest pain, palpitations, irregular heartbeat, syncope, dyspnea, diaphoresis, orthopnea, PND, claudication or edema Respiratory: denies cough, dyspnea, DOE, pleurisy, hoarseness, laryngitis or wheezing.  Gastrointestinal: Denies dysphagia, heartburn, reflux, water brash, pain, cramps, nausea, vomiting, bloating, diarrhea, constipation, hematemesis, melena, hematochezia, jaundice or hemorrhoids Genitourinary: Denies dysuria, frequency, urgency, nocturia, hesitancy, discharge, hematuria or flank pain Musculoskeletal: Denies arthralgia, myalgia, stiffness, Jt. Swelling, pain, limp or strain/sprain. Denies Falls. Skin: Denies puritis, rash, hives, warts, acne, eczema or change in skin lesion Neuro: No weakness, tremor, incoordination, spasms, paresthesia or pain Psychiatric: Denies confusion, memory loss or sensory loss. Denies Depression. Endocrine: Denies change in weight, skin, hair change, nocturia, and paresthesia, diabetic polys, visual blurring or hyper / hypo glycemic episodes.  Heme/Lymph: No excessive bleeding, bruising or enlarged lymph nodes.  Physical Exam  BP 118/84   Pulse 72   Temp 97.7 F (36.5 C)   Resp 16   Ht 5' 9.25" (1.759 m)   Wt 177 lb 6.4 oz (80.5 kg)   BMI 26.01 kg/m   General Appearance: Well nourished and well groomed and in no apparent distress.  Eyes: PERRLA, EOMs, conjunctiva no swelling or erythema, normal fundi and vessels. Sinuses: No  frontal/maxillary tenderness ENT/Mouth: EACs patent / TMs  nl. Nares clear without erythema, swelling, mucoid exudates. Oral hygiene is good. No erythema, swelling, or exudate. Tongue normal, non-obstructing. Tonsils not swollen or erythematous. Hearing normal.  Neck: Supple, thyroid not palpable. No bruits, nodes or JVD. Respiratory: Respiratory effort normal.  BS equal and clear bilateral without rales, rhonci, wheezing or stridor. Cardio: Heart sounds are normal with regular rate and rhythm and no murmurs, rubs or gallops. Peripheral pulses are normal and equal bilaterally without edema. No aortic or femoral bruits. Chest: symmetric with normal excursions and percussion.  Abdomen: Soft, with Nl bowel sounds. Nontender, no guarding, rebound, hernias, masses, or organomegaly.  Lymphatics: Non tender without lymphadenopathy.  Genitourinary: No hernias.Testes nl. DRE - prostate nl for age - smooth & firm w/o nodules. Musculoskeletal: Full ROM all peripheral extremities, joint stability, 5/5 strength, and normal gait. Skin: Warm and dry without rashes, lesions, cyanosis, clubbing or  ecchymosis.  Neuro: Cranial nerves intact, reflexes equal bilaterally. Normal muscle tone, no cerebellar symptoms. Sensation intact.  Pysch: Alert and oriented X 3 with normal affect, insight and judgment appropriate.   Assessment and  Plan  1. Annual Preventative/Screening Exam   2. Essential hypertension  - EKG 12-Lead - [place], RETROPERITNL ABD,  LTD - Urinalysis, Routine w reflex microscopic - Microalbumin / creatinine urine ratio - CBC with Differential/Platelet - BASIC METABOLIC PANEL WITH GFR - Magnesium - TSH  3. Hyperlipidemia, mixed  - EKG 12-Lead - [place], RETROPERITNL ABD,  LTD - Hepatic function panel - Lipid panel - TSH  4. Prediabetes  - EKG 12-Lead - [place], RETROPERITNL ABD,  LTD - Hemoglobin A1c - Insulin, random  5. Vitamin D deficiency  - VITAMIN D 25 Hydroxy  6. Testosterone  Deficiency  - Testosterone  7. Screening for colorectal cancer  - POC Hemoccult Bld/Stl  8. Prostate cancer screening  - PSA  9. Screening examination for pulmonary tuberculosis  - PPD  10. Screening for ischemic heart disease  - EKG 12-Lead  11. Screening for AAA (aortic abdominal aneurysm)  - [place], RETROPERITNL ABD,  LTD  12. Fatigue  - Iron,Total/Total Iron Binding Cap - Vitamin B12 - Testosterone - TSH  13. Medication management  - Urinalysis, Routine w reflex microscopic - Microalbumin / creatinine urine ratio - Testosterone - CBC with Differential/Platelet - BASIC METABOLIC PANEL WITH GFR - Hepatic function panel - Magnesium - Lipid panel - TSH - Hemoglobin A1c - Insulin, random - VITAMIN D 25 Hydroxyl        Patient was counseled in prudent diet, weight control to achieve/maintain BMI less than 25, BP monitoring, regular exercise and medications as discussed.  Discussed med effects and SE's. Routine screening labs and tests as requested with regular follow-up as recommended. Over 40 minutes of exam, counseling, chart review and high complex critical decision making was performed

## 2017-10-30 LAB — HEPATIC FUNCTION PANEL
AG Ratio: 1.9 (calc) (ref 1.0–2.5)
ALBUMIN MSPROF: 4.3 g/dL (ref 3.6–5.1)
ALT: 26 U/L (ref 9–46)
AST: 20 U/L (ref 10–35)
Alkaline phosphatase (APISO): 50 U/L (ref 40–115)
BILIRUBIN TOTAL: 1 mg/dL (ref 0.2–1.2)
Bilirubin, Direct: 0.2 mg/dL (ref 0.0–0.2)
GLOBULIN: 2.3 g/dL (ref 1.9–3.7)
Indirect Bilirubin: 0.8 mg/dL (calc) (ref 0.2–1.2)
Total Protein: 6.6 g/dL (ref 6.1–8.1)

## 2017-10-30 LAB — BASIC METABOLIC PANEL WITH GFR
BUN: 15 mg/dL (ref 7–25)
CO2: 28 mmol/L (ref 20–32)
Calcium: 9.2 mg/dL (ref 8.6–10.3)
Chloride: 100 mmol/L (ref 98–110)
Creat: 0.91 mg/dL (ref 0.70–1.33)
GFR, EST NON AFRICAN AMERICAN: 93 mL/min/{1.73_m2} (ref >=60)
GFR, Est African American: 107 mL/min/{1.73_m2} (ref >=60)
Glucose, Bld: 75 mg/dL (ref 65–99)
Potassium: 3.6 mmol/L (ref 3.5–5.3)
Sodium: 135 mmol/L (ref 135–146)

## 2017-10-30 LAB — URINALYSIS, ROUTINE W REFLEX MICROSCOPIC
Bilirubin Urine: NEGATIVE
GLUCOSE, UA: NEGATIVE
HGB URINE DIPSTICK: NEGATIVE
Ketones, ur: NEGATIVE
LEUKOCYTES UA: NEGATIVE
NITRITE: NEGATIVE
PROTEIN: NEGATIVE
Specific Gravity, Urine: 1.011 (ref 1.001–1.03)
pH: 5.5 (ref 5.0–8.0)

## 2017-10-30 LAB — PSA: PSA: 1.5 ng/mL (ref <=4.0)

## 2017-10-30 LAB — HEMOGLOBIN A1C
EAG (MMOL/L): 6.2 (calc)
Hgb A1c MFr Bld: 5.5 % of total Hgb (ref <5.7)
Mean Plasma Glucose: 111 (calc)

## 2017-10-30 LAB — TSH: TSH: 1.54 mIU/L (ref 0.40–4.50)

## 2017-10-30 LAB — LIPID PANEL
CHOL/HDL RATIO: 3.1 (calc) (ref <5.0)
Cholesterol: 188 mg/dL (ref <200)
HDL: 61 mg/dL (ref >40)
LDL Cholesterol (Calc): 113 mg/dL (calc) — ABNORMAL HIGH
NON-HDL CHOLESTEROL (CALC): 127 mg/dL (ref <130)
TRIGLYCERIDES: 62 mg/dL (ref <150)

## 2017-10-30 LAB — MICROALBUMIN / CREATININE URINE RATIO
CREATININE, URINE: 100 mg/dL (ref 20–320)
MICROALB UR: 0.8 mg/dL
MICROALB/CREAT RATIO: 8 ug/mg{creat} (ref <30)

## 2017-10-30 LAB — CBC WITH DIFFERENTIAL/PLATELET
BASOS ABS: 81 {cells}/uL (ref 0–200)
Basophils Relative: 0.7 %
EOS PCT: 1.2 %
Eosinophils Absolute: 138 cells/uL (ref 15–500)
HEMATOCRIT: 45.5 % (ref 38.5–50.0)
HEMOGLOBIN: 15.6 g/dL (ref 13.2–17.1)
LYMPHS ABS: 1875 {cells}/uL (ref 850–3900)
MCH: 31.5 pg (ref 27.0–33.0)
MCHC: 34.3 g/dL (ref 32.0–36.0)
MCV: 91.7 fL (ref 80.0–100.0)
MPV: 9.4 fL (ref 7.5–12.5)
Monocytes Relative: 7 %
NEUTROS ABS: 8602 {cells}/uL — AB (ref 1500–7800)
Neutrophils Relative %: 74.8 %
Platelets: 330 10*3/uL (ref 140–400)
RBC: 4.96 10*6/uL (ref 4.20–5.80)
RDW: 12.3 % (ref 11.0–15.0)
Total Lymphocyte: 16.3 %
WBC: 11.5 10*3/uL — ABNORMAL HIGH (ref 3.8–10.8)
WBCMIX: 805 {cells}/uL (ref 200–950)

## 2017-10-30 LAB — INSULIN, RANDOM: Insulin: 5.6 u[IU]/mL (ref 2.0–19.6)

## 2017-10-30 LAB — IRON, TOTAL/TOTAL IRON BINDING CAP
%SAT: 28 % (ref 15–60)
Iron: 80 ug/dL (ref 50–180)
TIBC: 289 mcg/dL (calc) (ref 250–425)

## 2017-10-30 LAB — VITAMIN B12: VITAMIN B 12: 426 pg/mL (ref 200–1100)

## 2017-10-30 LAB — MAGNESIUM: Magnesium: 1.6 mg/dL (ref 1.5–2.5)

## 2017-10-30 LAB — VITAMIN D 25 HYDROXY (VIT D DEFICIENCY, FRACTURES): Vit D, 25-Hydroxy: 79 ng/mL (ref 30–100)

## 2017-10-30 LAB — TESTOSTERONE: TESTOSTERONE: 940 ng/dL — AB (ref 250–827)

## 2017-10-31 ENCOUNTER — Encounter: Payer: Self-pay | Admitting: *Deleted

## 2017-10-31 LAB — TB SKIN TEST
INDURATION: 0 mm
TB Skin Test: NEGATIVE

## 2017-11-12 ENCOUNTER — Other Ambulatory Visit: Payer: Self-pay | Admitting: *Deleted

## 2017-11-12 MED ORDER — ATENOLOL 100 MG PO TABS: 100.0000 mg | ORAL_TABLET | Freq: Every day | ORAL | 1 refills | Status: DC

## 2017-12-16 ENCOUNTER — Encounter: Payer: Self-pay | Admitting: *Deleted

## 2017-12-18 ENCOUNTER — Encounter: Payer: Self-pay | Admitting: Internal Medicine

## 2017-12-23 ENCOUNTER — Encounter: Payer: Self-pay | Admitting: Internal Medicine

## 2018-01-08 HISTORY — PX: EYE SURGERY: SHX253

## 2018-01-14 ENCOUNTER — Other Ambulatory Visit: Payer: Self-pay

## 2018-01-14 ENCOUNTER — Ambulatory Visit (AMBULATORY_SURGERY_CENTER): Payer: Self-pay | Admitting: *Deleted

## 2018-01-14 VITALS — Ht 68.5 in | Wt 178.0 lb

## 2018-01-14 DIAGNOSIS — Z86010 Personal history of colonic polyps: Secondary | ICD-10-CM

## 2018-01-14 MED ORDER — SUPREP BOWEL PREP KIT 17.5-3.13-1.6 GM/177ML PO SOLN: 1.0000 | Freq: Once | ORAL | 0 refills | Status: AC

## 2018-01-14 NOTE — Progress Notes (Signed)
Patient denies any allergies to egg or soy products. Patient denies complications with anesthesia/sedation.  Patient denies oxygen use at home and denies diet medications. Pamphlet given on colonoscopy. 

## 2018-01-30 ENCOUNTER — Other Ambulatory Visit: Payer: Self-pay

## 2018-01-30 ENCOUNTER — Ambulatory Visit (AMBULATORY_SURGERY_CENTER): Payer: BLUE CROSS/BLUE SHIELD | Admitting: Internal Medicine

## 2018-01-30 ENCOUNTER — Encounter: Payer: Self-pay | Admitting: Internal Medicine

## 2018-01-30 VITALS — BP 93/60 | HR 55 | Temp 99.3°F | Resp 11 | Wt 178.0 lb

## 2018-01-30 DIAGNOSIS — D123 Benign neoplasm of transverse colon: Secondary | ICD-10-CM

## 2018-01-30 DIAGNOSIS — D124 Benign neoplasm of descending colon: Secondary | ICD-10-CM

## 2018-01-30 DIAGNOSIS — K649 Unspecified hemorrhoids: Secondary | ICD-10-CM

## 2018-01-30 DIAGNOSIS — Z86010 Personal history of colonic polyps: Secondary | ICD-10-CM

## 2018-01-30 MED ORDER — HYDROCORTISONE ACETATE 25 MG RE SUPP: 25.0000 mg | Freq: Every evening | RECTAL | Status: DC | PRN

## 2018-01-30 MED ORDER — SODIUM CHLORIDE 0.9 % IV SOLN: 500.0000 mL | Freq: Once | INTRAVENOUS | Status: DC

## 2018-01-30 NOTE — Patient Instructions (Signed)
Polyps information given.   YOU HAD AN ENDOSCOPIC PROCEDURE TODAY AT [facility]:   Refer to the procedure report that was given to you for any specific questions about what was found during the examination.  If the procedure report does not answer your questions, please call your gastroenterologist to clarify.  If you requested that your care partner not be given the details of your procedure findings, then the procedure report has been included in a sealed envelope for you to review at your convenience later.  YOU SHOULD EXPECT: Some feelings of bloating in the abdomen. Passage of more gas than usual.  Walking can help get rid of the air that was put into your GI tract during the procedure and reduce the bloating. If you had a lower endoscopy (such as a colonoscopy or flexible sigmoidoscopy) you may notice spotting of blood in your stool or on the toilet paper. If you underwent a bowel prep for your procedure, you may not have a normal bowel movement for a few days.  Please Note:  You might notice some irritation and congestion in your nose or some drainage.  This is from the oxygen used during your procedure.  There is no need for concern and it should clear up in a day or so.  SYMPTOMS TO REPORT IMMEDIATELY:   Following lower endoscopy (colonoscopy or flexible sigmoidoscopy):  Excessive amounts of blood in the stool  Significant tenderness or worsening of abdominal pains  Swelling of the abdomen that is new, acute  Fever of 100F or higher   For urgent or emergent issues, a gastroenterologist can be reached at any hour by calling [phone].   DIET:  We do recommend a small meal at first, but then you may proceed to your regular diet.  Drink plenty of fluids but you should avoid alcoholic beverages for 24 hours.  ACTIVITY:  You should plan to take it easy for the rest of today and you should NOT DRIVE or use heavy machinery until tomorrow (because of the sedation  medicines used during the test).    FOLLOW UP: Our staff will call the number listed on your records the next business day following your procedure to check on you and address any questions or concerns that you may have regarding the information given to you following your procedure. If we do not reach you, we will leave a message.  However, if you are feeling well and you are not experiencing any problems, there is no need to return our call.  We will assume that you have returned to your regular daily activities without incident.  If any biopsies were taken you will be contacted by phone or by letter within the next 1-3 weeks.  Please call us at [phone] if you have not heard about the biopsies in 3 weeks.    SIGNATURES/CONFIDENTIALITY: You and/or your care partner have signed paperwork which will be entered into your electronic medical record.  These signatures attest to the fact that that the information above on your After Visit Summary has been reviewed and is understood.  Full responsibility of the confidentiality of this discharge information lies with you and/or your care-partner.

## 2018-01-30 NOTE — Progress Notes (Signed)
Called to room to assist during endoscopic procedure.  Patient ID and intended procedure confirmed with present staff. Received instructions for my participation in the procedure from the performing physician.  

## 2018-01-30 NOTE — Progress Notes (Signed)
Report given to PACU, vss 

## 2018-01-30 NOTE — Op Note (Signed)
[facility] Patient Name: Jared Carr Procedure Date: 01/30/2018 2:23 PM MRN: [id number] Endoscopist: Jerene Bears , MD Age: 57 Referring MD:  Date of Birth: March 20, 1961 Gender: Male Account #: [id number] Procedure:                Colonoscopy Indications:              High risk colon cancer surveillance: Personal                            history of multiple (3 or more) adenomas, Last                            colonoscopy 3 years ago Medicines:                Monitored Anesthesia Care Procedure:                Pre-Anesthesia Assessment:                           - Prior to the procedure, a History and Physical                            was performed, and patient medications and                            allergies were reviewed. The patient's tolerance of                            previous anesthesia was also reviewed. The risks                            and benefits of the procedure and the sedation                            options and risks were discussed with the patient.                            All questions were answered, and informed consent                            was obtained. Prior Anticoagulants: The patient has                            taken no previous anticoagulant or antiplatelet                            agents. ASA Grade Assessment: II - A patient with                            mild systemic disease. After reviewing the risks                            and benefits, the patient was deemed in  satisfactory condition to undergo the procedure.                           After obtaining informed consent, the colonoscope                            was passed under direct vision. Throughout the                            procedure, the patient's blood pressure, pulse, and                            oxygen saturations were monitored continuously. The                            Colonoscope was introduced through the anus and                             advanced to the cecum, identified by appendiceal                            orifice and ileocecal valve. The colonoscopy was                            performed without difficulty. The patient tolerated                            the procedure well. The quality of the bowel                            preparation was good. The ileocecal valve,                            appendiceal orifice, and rectum were photographed. Scope In: 2:29:35 PM Scope Out: 2:43:34 PM Scope Withdrawal Time: 0 hours 10 minutes 36 seconds  Total Procedure Duration: 0 hours 13 minutes 59 seconds  Findings:                 The digital rectal exam was normal.                           A 5 mm polyp was found in the transverse colon. The                            polyp was sessile. The polyp was removed with a                            cold snare. Resection and retrieval were complete.                           Two sessile polyps were found in the descending                            colon. The polyps were 5  to 6 mm in size. These                            polyps were removed with a cold snare. Resection                            and retrieval were complete.                           Multiple small and large-mouthed diverticula were                            found in the sigmoid colon and descending colon.                           Internal hemorrhoids were found during                            retroflexion. The hemorrhoids were small. Complications:            No immediate complications. Estimated Blood Loss:     Estimated blood loss was minimal. Impression:               - One 5 mm polyp in the transverse colon, removed                            with a cold snare. Resected and retrieved.                           - Two 5 to 6 mm polyps in the descending colon,                            removed with a cold snare. Resected and retrieved.                           - Diverticulosis  in the sigmoid colon and in the                            descending colon.                           - Small internal hemorrhoids. Recommendation:           - Patient has a contact number available for                            emergencies. The signs and symptoms of potential                            delayed complications were discussed with the                            patient. Return to normal activities tomorrow.  Written discharge instructions were provided to the                            patient.                           - Resume previous diet.                           - Continue present medications.                           - If internal hemorrhoids are symptomatic,                            hydrocortisone suppository 25 mg can be tried at                            bedtime for 3-5 days. If persistently problematic,                            hemorrhoidal band ligation can be performed in my                            office.                           - Await pathology results.                           - Repeat colonoscopy is recommended for                            surveillance. The colonoscopy date will be                            determined after pathology results from today's                            exam become available for review. Jerene Bears, MD 01/30/2018 2:48:25 PM This report has been signed electronically.

## 2018-01-31 ENCOUNTER — Telehealth: Payer: Self-pay | Admitting: *Deleted

## 2018-01-31 NOTE — Telephone Encounter (Signed)
  Follow up Call-  Call back number 01/30/2018  Post procedure Call Back phone  # [phone]  Permission to leave phone message Yes  Some recent data might be hidden     Patient questions:  Do you have a fever, pain , or abdominal swelling? No. Pain Score  0 *  Have you tolerated food without any problems? Yes.    Have you been able to return to your normal activities? Yes.    Do you have any questions about your discharge instructions: Diet   No. Medications  No. Follow up visit  No.  Do you have questions or concerns about your Care? No.  Actions: * If pain score is 4 or above: No action needed, pain <4.

## 2018-02-03 ENCOUNTER — Telehealth: Payer: Self-pay | Admitting: Internal Medicine

## 2018-02-03 MED ORDER — HYDROCORTISONE ACETATE 25 MG RE SUPP: 25.0000 mg | Freq: Every evening | RECTAL | 0 refills | Status: DC | PRN

## 2018-02-03 NOTE — Telephone Encounter (Signed)
Rx sent. Looks like this was sent as an inpatient order previously in error.

## 2018-02-05 ENCOUNTER — Encounter: Payer: Self-pay | Admitting: Internal Medicine

## 2018-04-15 ENCOUNTER — Ambulatory Visit: Payer: Self-pay | Admitting: Internal Medicine

## 2018-04-30 ENCOUNTER — Ambulatory Visit: Payer: Self-pay | Admitting: Physician Assistant

## 2018-04-30 ENCOUNTER — Ambulatory Visit: Payer: Self-pay | Admitting: Adult Health

## 2018-05-08 ENCOUNTER — Other Ambulatory Visit: Payer: Self-pay | Admitting: Internal Medicine

## 2018-05-13 NOTE — Progress Notes (Signed)
Assessment and Plan:   Essential hypertension - continue medications, DASH diet, exercise and monitor at home. Call if greater than 130/80.  -     CBC with Differential/Platelet -     BASIC METABOLIC PANEL WITH GFR -     Hepatic function panel -     TSH  Testosterone Deficiency continue  Hyperlipidemia Off fenofibrate -     Lipid panel  Medication management -     Magnesium  Right inguinal hernia Worker's comp, getting surgery on his [birth year].   Continue diet and meds as discussed. Further disposition pending results of labs.  HPI 57 y.o. male  presents for 3 month follow up with hypertension, hyperlipidemia, prediabetes and vitamin D.   His blood pressure has been controlled at home, today their BP is BP: 116/68.   He does workout. He denies chest pain, shortness of breath, dizziness.   He is on cholesterol medication and denies myalgias. His cholesterol is at goal. The cholesterol last visit was:   Lab Results  Component Value Date   CHOL 188 10/29/2017   HDL 61 10/29/2017   LDLCALC 113 (H) 10/29/2017   TRIG 62 10/29/2017   CHOLHDL 3.1 10/29/2017    He has been working on diet and exercise for prediabetes, and denies foot ulcerations, hyperglycemia, hypoglycemia , increased appetite, nausea, paresthesia of the feet, polydipsia, polyuria, visual disturbances, vomiting and weight loss. Last A1C in the office was:  Lab Results  Component Value Date   HGBA1C 5.5 10/29/2017   Patient is on Vitamin D supplement.  Lab Results  Component Value Date   VD25OH 79 10/29/2017     He has a history of testosterone deficiency and is on testosterone replacement. He states that the testosterone helps with his energy, libido, muscle mass. Lab Results  Component Value Date   TESTOSTERONE 940 (H) 10/29/2017   BMI is Body mass index is 26.76 kg/m., he is working on diet and exercise and has been trying.  Wt Readings from Last 3 Encounters:  05/14/18 178 lb 9.6 oz (81 kg)   01/30/18 178 lb (80.7 kg)  01/14/18 178 lb (80.7 kg)    Current Medications:  Current Outpatient Medications on File Prior to Visit  Medication Sig Dispense Refill  . aspirin 81 MG tablet Take 81 mg by mouth daily.    Marland Kitchen atenolol (TENORMIN) 100 MG tablet TAKE 1 TABLET (100 MG TOTAL) BY MOUTH DAILY. FOR BLOOD PRESSURE 90 tablet 1  . B-D 3CC LUER-LOK SYR 21GX1" 21G X 1" 3 ML MISC USE AS DIRECTED 24 each PRN  . Cholecalciferol (VITAMIN D3) 5000 UNITS CAPS Take by mouth daily.    . magnesium gluconate (MAGONATE) 500 MG tablet Take 500 mg by mouth daily.    Marland Kitchen testosterone cypionate (DEPOTESTOSTERONE CYPIONATE) 200 MG/ML injection INJECT 1.5 ML EVERY 4 WEEKS 10 mL 1   Current Facility-Administered Medications on File Prior to Visit  Medication Dose Route Frequency Provider Last Rate Last Dose  . 0.9 %  sodium chloride infusion  500 mL Intravenous Once Pyrtle, Lajuan Lines, MD      . 0.9 %  sodium chloride infusion  500 mL Intravenous Once Pyrtle, Lajuan Lines, MD        Medical History:  Past Medical History:  Diagnosis Date  . Glaucoma   . Hyperlipemia   . Hypertension   . Hypogonadism male   . Vitamin D deficiency     Allergies:  Allergies  Allergen Reactions  . Viagra [Sildenafil  Citrate]     palpitations     Review of Systems:  Review of Systems  Constitutional: Negative for chills, fever and malaise/fatigue.  HENT: Negative for congestion, ear pain, nosebleeds and sore throat.   Eyes: Negative for blurred vision, double vision and discharge.  Respiratory: Negative for cough, shortness of breath and wheezing.   Cardiovascular: Negative for chest pain, palpitations and leg swelling.  Gastrointestinal: Negative for abdominal pain, blood in stool, constipation, diarrhea, heartburn and melena.  Genitourinary: Negative.   Skin: Negative.   Neurological: Negative for dizziness, sensory change, loss of consciousness and headaches.  Psychiatric/Behavioral: Negative for depression. The  patient is not nervous/anxious and does not have insomnia.     Family history- Review and unchanged  Social history- Review and unchanged  Physical Exam: BP 116/68   Pulse 66   Temp 98.6 F (37 C)   Resp 12   Ht 5' 8.5" (1.74 m)   Wt 178 lb 9.6 oz (81 kg)   SpO2 97%   BMI 26.76 kg/m  Wt Readings from Last 3 Encounters:  05/14/18 178 lb 9.6 oz (81 kg)  01/30/18 178 lb (80.7 kg)  01/14/18 178 lb (80.7 kg)    General Appearance: Well nourished well developed, in no apparent distress. Eyes: PERRLA, EOMs, conjunctiva no swelling or erythema ENT/Mouth: Ear canals normal without obstruction, swelling, erythma, discharge.  TMs normal bilaterally.  Oropharynx moist, clear, without exudate, or postoropharyngeal swelling. Neck: Supple, thyroid normal,no cervical adenopathy  Respiratory: Respiratory effort normal, Breath sounds clear A&P without rhonchi, wheeze, or rale.  No retractions, no accessory usage. Cardio: RRR with no MRGs. Brisk peripheral pulses without edema.  Abdomen: Soft, + BS,  Non tender, no guarding, rebound, hernias, masses. Musculoskeletal: Full ROM, 5/5 strength, Normal gait Skin: Warm, dry without rashes, lesions, ecchymosis.  Neuro: Awake and oriented X 3, Cranial nerves intact. Normal muscle tone, no cerebellar symptoms. Psych: Normal affect, Insight and Judgment appropriate.    Vicie Mutters, PA-C 3:59 PM [facility]

## 2018-05-14 ENCOUNTER — Encounter: Payer: Self-pay | Admitting: Physician Assistant

## 2018-05-14 ENCOUNTER — Ambulatory Visit: Payer: BLUE CROSS/BLUE SHIELD | Admitting: Physician Assistant

## 2018-05-14 VITALS — BP 116/68 | HR 66 | Temp 98.6°F | Resp 12 | Ht 68.5 in | Wt 178.6 lb

## 2018-05-14 DIAGNOSIS — Z79899 Other long term (current) drug therapy: Secondary | ICD-10-CM | POA: Diagnosis not present

## 2018-05-14 DIAGNOSIS — E782 Mixed hyperlipidemia: Secondary | ICD-10-CM

## 2018-05-14 DIAGNOSIS — R7309 Other abnormal glucose: Secondary | ICD-10-CM

## 2018-05-14 DIAGNOSIS — I10 Essential (primary) hypertension: Secondary | ICD-10-CM

## 2018-05-15 LAB — COMPLETE METABOLIC PANEL WITH GFR
AG Ratio: 1.6 (calc) (ref 1.0–2.5)
ALBUMIN MSPROF: 4.1 g/dL (ref 3.6–5.1)
ALT: 34 U/L (ref 9–46)
AST: 21 U/L (ref 10–35)
Alkaline phosphatase (APISO): 55 U/L (ref 40–115)
BUN: 14 mg/dL (ref 7–25)
CALCIUM: 9.7 mg/dL (ref 8.6–10.3)
CO2: 29 mmol/L (ref 20–32)
CREATININE: 0.98 mg/dL (ref 0.70–1.33)
Chloride: 104 mmol/L (ref 98–110)
GFR, EST AFRICAN AMERICAN: 98 mL/min/{1.73_m2} (ref >=60)
GFR, EST NON AFRICAN AMERICAN: 85 mL/min/{1.73_m2} (ref >=60)
GLUCOSE: 79 mg/dL (ref 65–99)
Globulin: 2.5 g/dL (calc) (ref 1.9–3.7)
Potassium: 4.1 mmol/L (ref 3.5–5.3)
Sodium: 142 mmol/L (ref 135–146)
TOTAL PROTEIN: 6.6 g/dL (ref 6.1–8.1)
Total Bilirubin: 1 mg/dL (ref 0.2–1.2)

## 2018-05-15 LAB — CBC WITH DIFFERENTIAL/PLATELET
BASOS PCT: 0.8 %
Basophils Absolute: 77 cells/uL (ref 0–200)
EOS ABS: 163 {cells}/uL (ref 15–500)
Eosinophils Relative: 1.7 %
HCT: 45.1 % (ref 38.5–50.0)
HEMOGLOBIN: 15.5 g/dL (ref 13.2–17.1)
Lymphs Abs: 2362 cells/uL (ref 850–3900)
MCH: 31.6 pg (ref 27.0–33.0)
MCHC: 34.4 g/dL (ref 32.0–36.0)
MCV: 91.9 fL (ref 80.0–100.0)
MPV: 9.5 fL (ref 7.5–12.5)
Monocytes Relative: 8.9 %
NEUTROS ABS: 6144 {cells}/uL (ref 1500–7800)
Neutrophils Relative %: 64 %
PLATELETS: 260 10*3/uL (ref 140–400)
RBC: 4.91 10*6/uL (ref 4.20–5.80)
RDW: 12.2 % (ref 11.0–15.0)
TOTAL LYMPHOCYTE: 24.6 %
WBC mixed population: 854 cells/uL (ref 200–950)
WBC: 9.6 10*3/uL (ref 3.8–10.8)

## 2018-05-15 LAB — LIPID PANEL
CHOL/HDL RATIO: 3.4 (calc) (ref <5.0)
CHOLESTEROL: 198 mg/dL (ref <200)
HDL: 58 mg/dL (ref >40)
LDL Cholesterol (Calc): 117 mg/dL (calc) — ABNORMAL HIGH
Non-HDL Cholesterol (Calc): 140 mg/dL (calc) — ABNORMAL HIGH (ref <130)
Triglycerides: 123 mg/dL (ref <150)

## 2018-05-15 LAB — TSH: TSH: 1.77 mIU/L (ref 0.40–4.50)

## 2018-05-15 LAB — MAGNESIUM: Magnesium: 1.9 mg/dL (ref 1.5–2.5)

## 2018-11-06 ENCOUNTER — Other Ambulatory Visit: Payer: Self-pay | Admitting: Adult Health

## 2018-11-09 ENCOUNTER — Encounter: Payer: Self-pay | Admitting: Internal Medicine

## 2018-11-09 NOTE — Patient Instructions (Signed)

## 2018-11-09 NOTE — Progress Notes (Signed)
[facility]   Unk Pinto, M.D.     [name], P.A.-C Liane Comber, [place]                [address] [place], [place] [address] Telephone [phone] Telefax [phone] Annual  Screening/Preventative Visit  & Comprehensive Evaluation & Examination     This very nice 58 y.o. Jared Carr  presents for a Screening /Preventative Visit & comprehensive evaluation and management of multiple medical co-morbidities.  Patient has been followed for HTN, HLD, Prediabetes, Low Testosterone and Vitamin D Deficiency.     HTN predates circa 1982. Patient's BP has been controlled at home.  Today's BP is at goal -  130/80. Patient denies any cardiac symptoms as chest pain, palpitations, shortness of breath, dizziness or ankle swelling.     Patient's hyperlipidemia is not controlled with diet. Last lipids were not at goal: Lab Results  Component Value Date   CHOL 198 05/14/2018   HDL 58 05/14/2018   LDLCALC 117 (H) 05/14/2018   TRIG 123 05/14/2018   CHOLHDL 3.4 05/14/2018      Patient has hx/o prediabetes  (A1c 6.1% / 2012) and patient denies reactive hypoglycemic symptoms, visual blurring, diabetic polys or paresthesias. Last A1c was Normal: Lab Results  Component Value Date   HGBA1C 5.5 10/29/2017       Finally, patient has history of Vitamin D Deficiency ("24" / 2008)  and last vitamin D was at goal: Lab Results  Component Value Date   VD25OH 79 10/29/2017   Current Outpatient Medications on File Prior to Visit  Medication Sig  . aspirin 81 MG tablet Take 81 mg by mouth daily.  Marland Kitchen atenolol (TENORMIN) 100 MG tablet TAKE 1 TABLET BY MOUTH EVERY DAY FOR BLOOD PRESSURE  . B-D 3CC LUER-LOK SYR 21GX1" 21G X 1" 3 ML MISC USE AS DIRECTED  . Cholecalciferol (VITAMIN D3) 5000 UNITS CAPS Take by mouth daily.  . magnesium gluconate (MAGONATE) 500 MG tablet Take 500 mg by mouth daily.  Marland Kitchen testosterone  cypionate (DEPOTESTOSTERONE CYPIONATE) 200 MG/ML injection INJECT 1.5 ML EVERY 4 WEEKS   No current facility-administered medications on file prior to visit.    Allergies  Allergen Reactions  . Viagra [Sildenafil Citrate]     palpitations   Past Medical History:  Diagnosis Date  . Glaucoma   . Hyperlipemia   . Hypertension   . Hypogonadism male   . Vitamin D deficiency    Health Maintenance  Topic Date Due  . Hepatitis C Screening  Aug 12, 1961  . HIV Screening  05/27/1974  . INFLUENZA VACCINE  04/17/2018  . TETANUS/TDAP  06/17/2021  . COLONOSCOPY  01/31/2023   Immunization History  Administered Date(s) Administered  . PPD Test 08/08/2015, 09/18/2016, 10/29/2017, 11/10/2018  . Pneumococcal-Unspecified 10/12/1997  . Td 06/30/2001  . Tdap 06/18/2011  . Zoster Recombinat (Shingrix) 01/03/2018, 05/15/2018   Last Colon - 01/30/2018 - Dr Hilarie Fredrickson - Recc 5 yr f/u due May 2024.  Past Surgical History:  Procedure Laterality Date  . COLONOSCOPY  01/2015  . EYE SURGERY Right 01/08/2018   laser for glaucoma  . FINGER SURGERY     left hand -pinky with screws  . ORIF FINGER FRACTURE  03/27/2012   Procedure: OPEN REDUCTION INTERNAL FIXATION (ORIF) METACARPAL (FINGER) FRACTURE;  Surgeon: Cammie Sickle., MD;  Location: [facility];  Service: Orthopedics;  Laterality: Left;  Left small proximal phalanx  . WISDOM TOOTH EXTRACTION     Family History  Problem Relation Age of Onset  . Hepatitis Father   . Liver cancer Father   . Diabetes Mother   . Gallstones Mother   . Breast cancer Sister   . Gallstones Brother   . Colon cancer Neg Hx   . Stomach cancer Neg Hx   . Rectal cancer Neg Hx    Social History   Socioeconomic History  . Marital status: Married    Spouse name: Jared [facility]  . Number of children: 2  Occupational History  . Occupation:  Engineer, maintenance (IT) / a Assembly  Tobacco Use  . Smoking status: Never Smoker  . Smokeless tobacco: Never Used  Substance  and Sexual Activity  . Alcohol use: Yes    Alcohol/week: 6.0 standard drinks    Types: 6 Standard drinks or equivalent per week    Comment: 6 beer/week  . Drug use: No  . Sexual activity: Yes    ROS Constitutional: Denies fever, chills, weight loss/gain, headaches, insomnia,  night sweats or change in appetite. Does c/o fatigue. Eyes: Denies redness, blurred vision, diplopia, discharge, itchy or watery eyes.  ENT: Denies discharge, congestion, post nasal drip, epistaxis, sore throat, earache, hearing loss, dental pain, Tinnitus, Vertigo, Sinus pain or snoring.  Cardio: Denies chest pain, palpitations, irregular heartbeat, syncope, dyspnea, diaphoresis, orthopnea, PND, claudication or edema Respiratory: denies cough, dyspnea, DOE, pleurisy, hoarseness, laryngitis or wheezing.  Gastrointestinal: Denies dysphagia, heartburn, reflux, water brash, pain, cramps, nausea, vomiting, bloating, diarrhea, constipation, hematemesis, melena, hematochezia, jaundice or hemorrhoids Genitourinary: Denies dysuria, frequency, urgency, nocturia, hesitancy, discharge, hematuria or flank pain Musculoskeletal: Denies arthralgia, myalgia, stiffness, Jt. Swelling, pain, limp or strain/sprain. Denies Falls. Skin: Denies puritis, rash, hives, warts, acne, eczema or change in skin lesion Neuro: No weakness, tremor, incoordination, spasms, paresthesia or pain Psychiatric: Denies confusion, memory loss or sensory loss. Denies Depression. Endocrine: Denies change in weight, skin, hair change, nocturia, and paresthesia, diabetic polys, visual blurring or hyper / hypo glycemic episodes.  Heme/Lymph: No excessive bleeding, bruising or enlarged lymph nodes.  Physical Exam  BP 130/80   Pulse (!) 52   Temp (!) 97.3 F (36.3 C)   Resp 16   Ht 5\' 9"  (1.753 m)   Wt 180 lb (81.6 kg)   BMI 26.58 kg/m   General Appearance: Well nourished and well groomed and in no apparent distress.  Eyes: PERRLA, EOMs, conjunctiva no  swelling or erythema, normal fundi and vessels. Sinuses: No frontal/maxillary tenderness ENT/Mouth: EACs patent / TMs  nl. Nares clear without erythema, swelling, mucoid exudates. Oral hygiene is good. No erythema, swelling, or exudate. Tongue normal, non-obstructing. Tonsils not swollen or erythematous. Hearing normal.  Neck: Supple, thyroid not palpable. No bruits, nodes or JVD. Respiratory: Respiratory effort normal.  BS equal and clear bilateral without rales, rhonci, wheezing or stridor. Cardio: Heart sounds are normal with regular rate and rhythm and no murmurs, rubs or gallops. Peripheral pulses are normal and equal bilaterally without edema. No aortic or femoral bruits. Chest: symmetric with normal excursions and percussion.  Abdomen: Soft, with Nl bowel sounds. Nontender, no guarding, rebound, hernias, masses, or organomegaly.  Lymphatics: Non tender without lymphadenopathy.  Musculoskeletal: Full ROM all peripheral extremities, joint stability, 5/5 strength, and normal gait. Skin: Warm and dry without rashes, lesions, cyanosis, clubbing or  ecchymosis.  Neuro: Cranial nerves intact, reflexes equal bilaterally. Normal muscle tone, no cerebellar  symptoms. Sensation intact.  Pysch: Alert and oriented X 3 with normal affect, insight and judgment appropriate.   Assessment and Plan  1. Annual Preventative/Screening Exam   2. Essential hypertension  - EKG 12-Lead - [place], RETROPERITNL ABD,  LTD - Urinalysis, Routine w reflex microscopic - Microalbumin / creatinine urine ratio - CBC with Differential/Platelet - COMPLETE METABOLIC PANEL WITH GFR - Magnesium - TSH  3. Hyperlipidemia, mixed  - EKG 12-Lead - [place], RETROPERITNL ABD,  LTD - Lipid panel - TSH  4. Abnormal glucose  - EKG 12-Lead - [place], RETROPERITNL ABD,  LTD - Hemoglobin A1c - Insulin, random  5. Vitamin D deficiency  - VITAMIN D 25 Hydroxyl  6. Prediabetes  - Hemoglobin A1c - Insulin, random  7. Testosterone  Deficiency  - Testosterone  8. Screening for colorectal cancer  - POC Hemoccult Bld/Stl   9. Prostate cancer screening  - PSA  10. Screening examination for pulmonary tuberculosis  - TB Skin Test  11. Screening for ischemic heart disease  - EKG 12-Lead  12. Screening for AAA (aortic abdominal aneurysm)  - [place], RETROPERITNL ABD,  LTD  13. Fatigue, unspecified type  - Iron,Total/Total Iron Binding Cap - Vitamin B12 - CBC with Differential/Platelet - TSH  14. Medication management  - Urinalysis, Routine w reflex microscopic - Microalbumin / creatinine urine ratio - Testosterone - CBC with Differential/Platelet - COMPLETE METABOLIC PANEL WITH GFR - Magnesium - Lipid panel - TSH - Hemoglobin A1c - Insulin, random - VITAMIN D 25 Hydroxyl        Patient was counseled in prudent diet, weight control to achieve/maintain BMI less than 25, BP monitoring, regular exercise and medications as discussed.  Discussed med effects and SE's. Routine screening labs and tests as requested with regular follow-up as recommended. Over 40 minutes of exam, counseling, chart review and high complex critical decision making was performed

## 2018-11-10 ENCOUNTER — Ambulatory Visit: Payer: BLUE CROSS/BLUE SHIELD | Admitting: Internal Medicine

## 2018-11-10 ENCOUNTER — Encounter: Payer: Self-pay | Admitting: Internal Medicine

## 2018-11-10 VITALS — BP 130/80 | HR 52 | Temp 97.3°F | Resp 16 | Ht 69.0 in | Wt 180.0 lb

## 2018-11-10 DIAGNOSIS — Z136 Encounter for screening for cardiovascular disorders: Secondary | ICD-10-CM

## 2018-11-10 DIAGNOSIS — R350 Frequency of micturition: Secondary | ICD-10-CM | POA: Diagnosis not present

## 2018-11-10 DIAGNOSIS — Z1211 Encounter for screening for malignant neoplasm of colon: Secondary | ICD-10-CM

## 2018-11-10 DIAGNOSIS — Z125 Encounter for screening for malignant neoplasm of prostate: Secondary | ICD-10-CM | POA: Diagnosis not present

## 2018-11-10 DIAGNOSIS — Z13220 Encounter for screening for lipoid disorders: Secondary | ICD-10-CM | POA: Diagnosis not present

## 2018-11-10 DIAGNOSIS — E291 Testicular hypofunction: Secondary | ICD-10-CM

## 2018-11-10 DIAGNOSIS — Z131 Encounter for screening for diabetes mellitus: Secondary | ICD-10-CM

## 2018-11-10 DIAGNOSIS — Z0000 Encounter for general adult medical examination without abnormal findings: Secondary | ICD-10-CM | POA: Diagnosis not present

## 2018-11-10 DIAGNOSIS — E559 Vitamin D deficiency, unspecified: Secondary | ICD-10-CM | POA: Diagnosis not present

## 2018-11-10 DIAGNOSIS — Z1389 Encounter for screening for other disorder: Secondary | ICD-10-CM | POA: Diagnosis not present

## 2018-11-10 DIAGNOSIS — Z111 Encounter for screening for respiratory tuberculosis: Secondary | ICD-10-CM | POA: Diagnosis not present

## 2018-11-10 DIAGNOSIS — Z0001 Encounter for general adult medical examination with abnormal findings: Secondary | ICD-10-CM

## 2018-11-10 DIAGNOSIS — R7309 Other abnormal glucose: Secondary | ICD-10-CM

## 2018-11-10 DIAGNOSIS — Z79899 Other long term (current) drug therapy: Secondary | ICD-10-CM

## 2018-11-10 DIAGNOSIS — Z130 Encounter for screening for diseases of the blood and blood-forming organs and certain disorders involving the immune mechanism: Secondary | ICD-10-CM

## 2018-11-10 DIAGNOSIS — R5383 Other fatigue: Secondary | ICD-10-CM

## 2018-11-10 DIAGNOSIS — N401 Enlarged prostate with lower urinary tract symptoms: Secondary | ICD-10-CM | POA: Diagnosis not present

## 2018-11-10 DIAGNOSIS — I10 Essential (primary) hypertension: Secondary | ICD-10-CM | POA: Diagnosis not present

## 2018-11-10 DIAGNOSIS — Z1329 Encounter for screening for other suspected endocrine disorder: Secondary | ICD-10-CM | POA: Diagnosis not present

## 2018-11-10 DIAGNOSIS — R7303 Prediabetes: Secondary | ICD-10-CM

## 2018-11-10 DIAGNOSIS — E782 Mixed hyperlipidemia: Secondary | ICD-10-CM

## 2018-11-10 DIAGNOSIS — Z1212 Encounter for screening for malignant neoplasm of rectum: Secondary | ICD-10-CM

## 2018-11-11 ENCOUNTER — Encounter: Payer: Self-pay | Admitting: *Deleted

## 2018-11-11 LAB — PSA: PSA: 1.4 ng/mL (ref <=4.0)

## 2018-11-11 LAB — URINALYSIS, ROUTINE W REFLEX MICROSCOPIC
BILIRUBIN URINE: NEGATIVE
Glucose, UA: NEGATIVE
Hgb urine dipstick: NEGATIVE
KETONES UR: NEGATIVE
Leukocytes,Ua: NEGATIVE
NITRITE: NEGATIVE
Protein, ur: NEGATIVE
SPECIFIC GRAVITY, URINE: 1.013 (ref 1.001–1.03)
pH: 5.5 (ref 5.0–8.0)

## 2018-11-11 LAB — CBC WITH DIFFERENTIAL/PLATELET
Absolute Monocytes: 713 cells/uL (ref 200–950)
Basophils Absolute: 68 cells/uL (ref 0–200)
Basophils Relative: 0.9 %
Eosinophils Absolute: 188 cells/uL (ref 15–500)
Eosinophils Relative: 2.5 %
HCT: 44.6 % (ref 38.5–50.0)
Hemoglobin: 15.4 g/dL (ref 13.2–17.1)
Lymphs Abs: 2535 cells/uL (ref 850–3900)
MCH: 31.6 pg (ref 27.0–33.0)
MCHC: 34.5 g/dL (ref 32.0–36.0)
MCV: 91.6 fL (ref 80.0–100.0)
MONOS PCT: 9.5 %
MPV: 9.7 fL (ref 7.5–12.5)
Neutro Abs: 3998 cells/uL (ref 1500–7800)
Neutrophils Relative %: 53.3 %
Platelets: 298 10*3/uL (ref 140–400)
RBC: 4.87 10*6/uL (ref 4.20–5.80)
RDW: 11.9 % (ref 11.0–15.0)
Total Lymphocyte: 33.8 %
WBC: 7.5 10*3/uL (ref 3.8–10.8)

## 2018-11-11 LAB — COMPLETE METABOLIC PANEL WITH GFR
AG Ratio: 1.4 (calc) (ref 1.0–2.5)
ALT: 33 U/L (ref 9–46)
AST: 22 U/L (ref 10–35)
Albumin: 4.2 g/dL (ref 3.6–5.1)
Alkaline phosphatase (APISO): 54 U/L (ref 35–144)
BUN: 20 mg/dL (ref 7–25)
CO2: 28 mmol/L (ref 20–32)
Calcium: 9.3 mg/dL (ref 8.6–10.3)
Chloride: 104 mmol/L (ref 98–110)
Creat: 0.84 mg/dL (ref 0.70–1.33)
GFR, Est African American: 111 mL/min/{1.73_m2} (ref >=60)
GFR, Est Non African American: 96 mL/min/{1.73_m2} (ref >=60)
GLUCOSE: 77 mg/dL (ref 65–99)
Globulin: 2.9 g/dL (calc) (ref 1.9–3.7)
Potassium: 4.1 mmol/L (ref 3.5–5.3)
Sodium: 140 mmol/L (ref 135–146)
Total Bilirubin: 1.4 mg/dL — ABNORMAL HIGH (ref 0.2–1.2)
Total Protein: 7.1 g/dL (ref 6.1–8.1)

## 2018-11-11 LAB — LIPID PANEL
Cholesterol: 193 mg/dL (ref <200)
HDL: 56 mg/dL (ref >=40)
LDL Cholesterol (Calc): 117 mg/dL (calc) — ABNORMAL HIGH
Non-HDL Cholesterol (Calc): 137 mg/dL (calc) — ABNORMAL HIGH (ref <130)
TRIGLYCERIDES: 95 mg/dL (ref <150)
Total CHOL/HDL Ratio: 3.4 (calc) (ref <5.0)

## 2018-11-11 LAB — HEMOGLOBIN A1C
Hgb A1c MFr Bld: 5.6 % of total Hgb (ref <5.7)
Mean Plasma Glucose: 114 (calc)
eAG (mmol/L): 6.3 (calc)

## 2018-11-11 LAB — IRON, TOTAL/TOTAL IRON BINDING CAP
%SAT: 75 % (calc) — ABNORMAL HIGH (ref 20–48)
Iron: 211 ug/dL — ABNORMAL HIGH (ref 50–180)
TIBC: 283 ug/dL (ref 250–425)

## 2018-11-11 LAB — VITAMIN D 25 HYDROXY (VIT D DEFICIENCY, FRACTURES): Vit D, 25-Hydroxy: 100 ng/mL (ref 30–100)

## 2018-11-11 LAB — TESTOSTERONE: Testosterone: 340 ng/dL (ref 250–827)

## 2018-11-11 LAB — MICROALBUMIN / CREATININE URINE RATIO
Creatinine, Urine: 64 mg/dL (ref 20–320)
MICROALB UR: 0.2 mg/dL
Microalb Creat Ratio: 3 mcg/mg creat (ref <30)

## 2018-11-11 LAB — MAGNESIUM: Magnesium: 1.9 mg/dL (ref 1.5–2.5)

## 2018-11-11 LAB — TSH: TSH: 1.95 mIU/L (ref 0.40–4.50)

## 2018-11-11 LAB — INSULIN, RANDOM: Insulin: 4.4 u[IU]/mL

## 2018-11-11 LAB — VITAMIN B12: Vitamin B-12: 533 pg/mL (ref 200–1100)

## 2018-11-12 LAB — TB SKIN TEST
Induration: 0 mm
TB Skin Test: NEGATIVE

## 2019-04-03 ENCOUNTER — Other Ambulatory Visit: Payer: Self-pay

## 2019-04-03 DIAGNOSIS — Z20822 Contact with and (suspected) exposure to covid-19: Secondary | ICD-10-CM

## 2019-04-08 LAB — NOVEL CORONAVIRUS, NAA: SARS-CoV-2, NAA: NOT DETECTED

## 2019-04-26 ENCOUNTER — Other Ambulatory Visit: Payer: Self-pay | Admitting: Internal Medicine

## 2019-05-13 NOTE — Progress Notes (Signed)
Assessment and Plan:   Essential hypertension - continue medications, DASH diet, exercise and monitor at home. Call if greater than 130/80.  -     CBC with Differential/Platelet -     CMP/GFR  Abnormal glucose Recent A1Cs at goal Discussed diet/exercise, weight management  Defer A1C; check CMP  Hyperlipidemia Off fenofibrate, LDL above goal, working on lifestyle otherwise low cardiovascular risk  LDL goal <100, defer medications unless 130+ -     Lipid panel -     TSH  BMI 26  Continue to recommend diet heavy in fruits and veggies and low in animal meats, cheeses, and dairy products, appropriate calorie intake Discuss exercise recommendations routinely Continue to monitor weight at each visit  Testosterone Deficiency -has been off of replacement therapy x 6 months, on zinc 50 mg daily supplement and reports doing fairly, requests recheck testosterone levels today  Discussed natural methods to boost testosterone    Continue diet and meds as discussed. Further disposition pending results of labs.  Future Appointments  Date Time Provider [place]  11/30/2019  2:00 PM Unk Pinto, MD [id number] None     HPI BP 124/80   Pulse (!) 50   Temp 97.9 F (36.6 C)   Ht 5\' 9"  (1.753 m)   Wt 178 lb 6.4 oz (80.9 kg)   SpO2 98%   BMI 26.35 kg/m   58 y.o. male  presents for 3 month follow up with hypertension, hyperlipidemia, glucose management, testosterone deficiency and vitamin D.  BMI is Body mass index is 26.35 kg/m., he has been working on diet and exercise, walks 3-5 miles daily up front.  Wt Readings from Last 3 Encounters:  05/14/19 178 lb 6.4 oz (80.9 kg)  11/10/18 180 lb (81.6 kg)  05/14/18 178 lb 9.6 oz (81 kg)    His blood pressure has been controlled at home, today their BP is BP: 124/80.    He does workout. He denies chest pain, shortness of breath, dizziness.   He is not on cholesterol medication and denies myalgias.  His cholesterol is not at  goal. The cholesterol last visit was:   Lab Results  Component Value Date   CHOL 193 11/10/2018   HDL 56 11/10/2018   LDLCALC 117 (H) 11/10/2018   TRIG 95 11/10/2018   CHOLHDL 3.4 11/10/2018    He has been working on diet and exercise for hx of prediabetes, and denies foot ulcerations, hyperglycemia, hypoglycemia , increased appetite, nausea, paresthesia of the feet, polydipsia, polyuria, visual disturbances, vomiting and weight loss. Last A1C in the office was:  Lab Results  Component Value Date   HGBA1C 5.6 11/10/2018   Patient is on Vitamin D supplement.  Lab Results  Component Value Date   VD25OH 100 11/10/2018     He has a history of testosterone deficiency, he has stopped injections, 6 months ago, now on zinc 50 mg supplement only.  He reports energy levels remain stable, can't tell a difference since stopping, no recurrence of fatigue.  Lab Results  Component Value Date   TESTOSTERONE 340 11/10/2018    Current Medications:  Current Outpatient Medications on File Prior to Visit  Medication Sig Dispense Refill  . aspirin 81 MG tablet Take 81 mg by mouth daily.    Marland Kitchen atenolol (TENORMIN) 100 MG tablet Take 1 tablet Daily for BP 90 tablet 1  . Cholecalciferol (VITAMIN D3) 5000 UNITS CAPS Take by mouth daily.    . magnesium gluconate (MAGONATE) 500 MG tablet Take  500 mg by mouth daily.    . Zinc 50 MG TABS Take by mouth daily.    . B-D 3CC LUER-LOK SYR 21GX1" 21G X 1" 3 ML MISC USE AS DIRECTED (Patient not taking: Reported on 05/14/2019) 24 each PRN  . testosterone cypionate (DEPOTESTOSTERONE CYPIONATE) 200 MG/ML injection INJECT 1.5 ML EVERY 4 WEEKS (Patient not taking: Reported on 05/14/2019) 10 mL 1   No current facility-administered medications on file prior to visit.     Medical History:  Past Medical History:  Diagnosis Date  . Glaucoma   . Hyperlipemia   . Hypertension   . Hypogonadism male   . Vitamin D deficiency     Allergies:  Allergies  Allergen Reactions   . Viagra [Sildenafil Citrate]     palpitations     Review of Systems:  Review of Systems  Constitutional: Negative for chills, fever and malaise/fatigue.  HENT: Negative for congestion, ear pain, nosebleeds and sore throat.   Eyes: Negative for blurred vision, double vision and discharge.  Respiratory: Negative for cough, shortness of breath and wheezing.   Cardiovascular: Negative for chest pain, palpitations and leg swelling.  Gastrointestinal: Negative for abdominal pain, blood in stool, constipation, diarrhea, heartburn and melena.  Genitourinary: Negative.   Skin: Negative.   Neurological: Negative for dizziness, sensory change, loss of consciousness and headaches.  Psychiatric/Behavioral: Negative for depression. The patient is not nervous/anxious and does not have insomnia.     Family history- Review and unchanged  Social history- Review and unchanged  Physical Exam: BP 124/80   Pulse (!) 50   Temp 97.9 F (36.6 C)   Ht 5\' 9"  (1.753 m)   Wt 178 lb 6.4 oz (80.9 kg)   SpO2 98%   BMI 26.35 kg/m  Wt Readings from Last 3 Encounters:  05/14/19 178 lb 6.4 oz (80.9 kg)  11/10/18 180 lb (81.6 kg)  05/14/18 178 lb 9.6 oz (81 kg)    General Appearance: Well nourished well developed, in no apparent distress. Eyes: PERRLA, EOMs, conjunctiva no swelling or erythema ENT/Mouth: Ear canals normal without obstruction, swelling, erythma, discharge.  TMs normal bilaterally.  Oropharynx moist, clear, without exudate, or postoropharyngeal swelling. Neck: Supple, thyroid normal,no cervical adenopathy  Respiratory: Respiratory effort normal, Breath sounds clear A&P without rhonchi, wheeze, or rale.  No retractions, no accessory usage. Cardio: RRR with no MRGs. Brisk peripheral pulses without edema.  Abdomen: Soft, + BS,  Non tender, no guarding, rebound, hernias, masses. Musculoskeletal: Full ROM, 5/5 strength, Normal gait Skin: Warm, dry without rashes, lesions, ecchymosis.  Neuro:  Awake and oriented X 3, Cranial nerves intact. Normal muscle tone, no cerebellar symptoms. Psych: Normal affect, Insight and Judgment appropriate.    Izora Ribas, NP 2:50 PM [facility]

## 2019-05-14 ENCOUNTER — Ambulatory Visit: Payer: BC Managed Care – PPO | Admitting: Adult Health

## 2019-05-14 ENCOUNTER — Other Ambulatory Visit: Payer: Self-pay

## 2019-05-14 ENCOUNTER — Encounter: Payer: Self-pay | Admitting: Adult Health

## 2019-05-14 VITALS — BP 124/80 | HR 50 | Temp 97.9°F | Ht 69.0 in | Wt 178.4 lb

## 2019-05-14 DIAGNOSIS — E559 Vitamin D deficiency, unspecified: Secondary | ICD-10-CM | POA: Diagnosis not present

## 2019-05-14 DIAGNOSIS — Z6826 Body mass index (BMI) 26.0-26.9, adult: Secondary | ICD-10-CM

## 2019-05-14 DIAGNOSIS — R7309 Other abnormal glucose: Secondary | ICD-10-CM | POA: Diagnosis not present

## 2019-05-14 DIAGNOSIS — E291 Testicular hypofunction: Secondary | ICD-10-CM

## 2019-05-14 DIAGNOSIS — I10 Essential (primary) hypertension: Secondary | ICD-10-CM

## 2019-05-14 DIAGNOSIS — Z79899 Other long term (current) drug therapy: Secondary | ICD-10-CM

## 2019-05-14 DIAGNOSIS — E782 Mixed hyperlipidemia: Secondary | ICD-10-CM

## 2019-05-14 NOTE — Patient Instructions (Signed)
Bradycardia, Adult Bradycardia is a slower-than-normal heartbeat. A normal resting heart rate for an adult ranges from 60 to 100 beats per minute. With bradycardia, the resting heart rate is less than 60 beats per minute. Bradycardia can prevent enough oxygen from reaching certain areas of your body when you are active. It can be serious if it keeps enough oxygen from reaching your brain and other parts of your body. Bradycardia is not a problem for everyone. For some healthy adults, a slow resting heart rate is normal. What are the causes? This condition may be caused by:  A problem with the heart, including: ? A problem with the heart's electrical system, such as a heart block. With a heart block, electrical signals between the chambers of the heart are partially or completely blocked, so they are not able to work as they should. ? A problem with the heart's natural pacemaker (sinus node). ? Heart disease. ? A heart attack. ? Heart damage. ? Lyme disease. ? A heart infection. ? A heart condition that is present at birth (congenital heart defect).  Certain medicines that treat heart conditions.  Certain conditions, such as hypothyroidism and obstructive sleep apnea.  Problems with the balance of chemicals and other substances, like potassium, in the blood.  Trauma.  Radiation therapy. What increases the risk? You are more likely to develop this condition if you:  Are age 65 or older.  Have high blood pressure (hypertension), high cholesterol (hyperlipidemia), or diabetes.  Drink heavily, use tobacco or nicotine products, or use drugs. What are the signs or symptoms? Symptoms of this condition include:  Light-headedness.  Feeling faint or fainting.  Fatigue and weakness.  Trouble with activity or exercise.  Shortness of breath.  Chest pain (angina).  Drowsiness.  Confusion.  Dizziness. How is this diagnosed? This condition may be diagnosed based on:  Your  symptoms.  Your medical history.  A physical exam. During the exam, your health care provider will listen to your heartbeat and check your pulse. To confirm the diagnosis, your health care provider may order tests, such as:  Blood tests.  An electrocardiogram (ECG). This test records the heart's electrical activity. The test can show how fast your heart is beating and whether the heartbeat is steady.  A test in which you wear a portable device (event recorder or Holter monitor) to record your heart's electrical activity while you go about your day.  Anexercise test. How is this treated? Treatment for this condition depends on the cause of the condition and how severe your symptoms are. Treatment may involve:  Treatment of the underlying condition.  Changing your medicines or how much medicine you take.  Having a small, battery-operated device called a pacemaker implanted under the skin. When bradycardia occurs, this device can be used to increase your heart rate and help your heart beat in a regular rhythm. Follow these instructions at home: Lifestyle   Manage any health conditions that contribute to bradycardia as told by your health care provider.  Follow a heart-healthy diet. A nutrition specialist (dietitian) can help educate you about healthy food options and changes.  Follow an exercise program that is approved by your health care provider.  Maintain a healthy weight.  Try to reduce or manage your stress, such as with yoga or meditation. If you need help reducing stress, ask your health care provider.  Do not use any products that contain nicotine or tobacco, such as cigarettes, e-cigarettes, and chewing tobacco. If you need help   quitting, ask your health care provider.  Do not use illegal drugs.  Limit alcohol intake to no more than 1 drink a day for nonpregnant women and 2 drinks a day for men. Be aware of how much alcohol is in your drink. In the [place] one drink  equals one 12 oz bottle of beer (355 mL), one 5 oz glass of wine (148 mL), or one 1 oz glass of hard liquor (44 mL). General instructions  Take over-the-counter and prescription medicines only as told by your health care provider.  Keep all follow-up visits as told by your health care provider. This is important. How is this prevented? In some cases, bradycardia may be prevented by:  Treating underlying medical problems.  Stopping behaviors or medicines that can trigger the condition. Contact a health care provider if you:  Feel light-headed or dizzy.  Almost faint.  Feel weak or are easily fatigued during physical activity.  Experience confusion or have memory problems. Get help right away if:  You faint.  You have: ? An irregular heartbeat (palpitations). ? Chest pain. ? Trouble breathing. Summary  Bradycardia is a slower-than-normal heartbeat. With bradycardia, the resting heart rate is less than 60 beats per minute.  Treatment for this condition depends on the cause.  Manage any health conditions that contribute to bradycardia as told by your health care provider.  Do not use any products that contain nicotine or tobacco, such as cigarettes, e-cigarettes, and chewing tobacco, and limit alcohol intake.  Keep all follow-up visits as told by your health care provider. This is important. This information is not intended to replace advice given to you by your health care provider. Make sure you discuss any questions you have with your health care provider. Document Released: 05/26/2002 Document Revised: 03/17/2018 Document Reviewed: 02/12/2018 Elsevier Patient Education  [place].     Can do zinc 40-50 mg a day Can try shake that is whey protein, almond milk, avocado oil, and spinach and strawberries in the morning  9 Ways to Naturally Increase Testosterone Levels  1.   Lose Weight If you're overweight, shedding the excess pounds may increase your  testosterone levels, according to research presented at the Endocrine Society's 2012 meeting. Overweight men are more likely to have low testosterone levels to begin with, so this is an important trick to increase your body's testosterone production when you need it most.  2.   High-Intensity Exercise like Peak Fitness  Short intense exercise has a proven positive effect on increasing testosterone levels and preventing its decline. That's unlike aerobics or prolonged moderate exercise, which have shown to have negative or no effect on testosterone levels. Having a whey protein meal after exercise can further enhance the satiety/testosterone-boosting impact (hunger hormones cause the opposite effect on your testosterone and libido). Here's a summary of what a typical high-intensity Peak Fitness routine might look like: " Warm up for three minutes  " Exercise as hard and fast as you can for 30 seconds. You should feel like you couldn't possibly go on another few seconds  " Recover at a slow to moderate pace for 90 seconds  " Repeat the high intensity exercise and recovery 7 more times .  3.   Consume Plenty of Zinc The mineral zinc is important for testosterone production, and supplementing your diet for as little as six weeks has been shown to cause a marked improvement in testosterone among men with low levels.1 Likewise, research has shown that restricting dietary sources  of zinc leads to a significant decrease in testosterone, while zinc supplementation increases it2 -- and even protects men from exercised-induced reductions in testosterone levels.3 It's estimated that up to 44 percent of adults over the age of 60 may have lower than recommended zinc intakes; even when dietary supplements were added in, an estimated 20-25 percent of older adults still had inadequate zinc intakes, according to a [employer] and Nutrition Examination Survey.4 Your diet is the best source of zinc; along with  protein-rich foods like meats and fish, other good dietary sources of zinc include raw milk, raw cheese, beans, and yogurt or kefir made from raw milk. It can be difficult to obtain enough dietary zinc if you're a vegetarian, and also for meat-eaters as well, largely because of conventional farming methods that rely heavily on chemical fertilizers and pesticides. These chemicals deplete the soil of nutrients ... nutrients like zinc that must be absorbed by plants in order to be passed on to you. In many cases, you may further deplete the nutrients in your food by the way you prepare it. For most food, cooking it will drastically reduce its levels of nutrients like zinc ... particularly over-cooking, which many people do. If you decide to use a zinc supplement, stick to a dosage of less than 40 mg a day, as this is the recommended adult upper limit. Taking too much zinc can interfere with your body's ability to absorb other minerals, especially copper, and may cause nausea as a side effect.  4.   Strength Training In addition to Peak Fitness, strength training is also known to boost testosterone levels, provided you are doing so intensely enough. When strength training to boost testosterone, you'll want to increase the weight and lower your number of reps, and then focus on exercises that work a large number of muscles, such as dead lifts or squats.  You can "turbo-charge" your weight training by going slower. By slowing down your movement, you're actually turning it into a high-intensity exercise. Super Slow movement allows your muscle, at the microscopic level, to access the maximum number of cross-bridges between the protein filaments that produce movement in the muscle.   5.   Optimize Your Vitamin D Levels Vitamin D, a steroid hormone, is essential for the healthy development of the nucleus of the sperm cell, and helps maintain semen quality and sperm count. Vitamin D also increases levels of  testosterone, which may boost libido. In one study, overweight men who were given vitamin D supplements had a significant increase in testosterone levels after one year.5   6.   Reduce Stress When you're under a lot of stress, your body releases high levels of the stress hormone cortisol. This hormone actually blocks the effects of testosterone,6 presumably because, from a biological standpoint, testosterone-associated behaviors (mating, competing, aggression) may have lowered your chances of survival in an emergency (hence, the "fight or flight" response is dominant, courtesy of cortisol).  7.   Limit or Eliminate Sugar from Your Diet Testosterone levels decrease after you eat sugar, which is likely because the sugar leads to a high insulin level, another factor leading to low testosterone.7 Based on USDA estimates, the average American consumes 12 teaspoons of sugar a day, which equates to about TWO TONS of sugar during a lifetime.  8.   Eat Healthy Fats By healthy, this means not only mon- and polyunsaturated fats, like that found in avocadoes and nuts, but also saturated, as these are essential for building testosterone.  Research shows that a diet with less than 40 percent of energy as fat (and that mainly from animal sources, i.e. saturated) lead to a decrease in testosterone levels.8 My personal diet is about 60-70 percent healthy fat, and other experts agree that the ideal diet includes somewhere between 50-70 percent fat.  It's important to understand that your body requires saturated fats from animal and vegetable sources (such as meat, dairy, certain oils, and tropical plants like coconut) for optimal functioning, and if you neglect this important food group in favor of sugar, grains and other starchy carbs, your health and weight are almost guaranteed to suffer. Examples of healthy fats you can eat more of to give your testosterone levels a boost include: Olives and Olive oil  Coconuts and  coconut oil Butter made from raw grass-fed organic milk Raw nuts, such as, almonds or pecans Organic pastured egg yolks Avocados Grass-fed meats Palm oil Unheated organic nut oils   9.   Boost Your Intake of Branch Chain Amino Acids (BCAA) from Foods Like [place] suggests that BCAAs result in higher testosterone levels, particularly when taken along with resistance training.9 While BCAAs are available in supplement form, you'll find the highest concentrations of BCAAs like leucine in dairy products - especially quality cheeses and whey protein. Even when getting leucine from your natural food supply, it's often wasted or used as a building block instead of an anabolic agent. So to create the correct anabolic environment, you need to boost leucine consumption way beyond mere maintenance levels. That said, keep in mind that using leucine as a free form amino acid can be highly counterproductive as when free form amino acids are artificially administrated, they rapidly enter your circulation while disrupting insulin function, and impairing your body's glycemic control. Food-based leucine is really the ideal form that can benefit your muscles without side effects.

## 2019-05-15 LAB — LIPID PANEL
Cholesterol: 194 mg/dL (ref <200)
HDL: 54 mg/dL (ref >=40)
LDL Cholesterol (Calc): 118 mg/dL (calc) — ABNORMAL HIGH
Non-HDL Cholesterol (Calc): 140 mg/dL (calc) — ABNORMAL HIGH (ref <130)
Total CHOL/HDL Ratio: 3.6 (calc) (ref <5.0)
Triglycerides: 114 mg/dL (ref <150)

## 2019-05-15 LAB — COMPLETE METABOLIC PANEL WITH GFR
AG Ratio: 1.8 (calc) (ref 1.0–2.5)
ALT: 34 U/L (ref 9–46)
AST: 22 U/L (ref 10–35)
Albumin: 4.1 g/dL (ref 3.6–5.1)
Alkaline phosphatase (APISO): 59 U/L (ref 35–144)
BUN: 13 mg/dL (ref 7–25)
CO2: 28 mmol/L (ref 20–32)
Calcium: 9.3 mg/dL (ref 8.6–10.3)
Chloride: 104 mmol/L (ref 98–110)
Creat: 0.91 mg/dL (ref 0.70–1.33)
GFR, Est African American: 107 mL/min/{1.73_m2} (ref >=60)
GFR, Est Non African American: 92 mL/min/{1.73_m2} (ref >=60)
Globulin: 2.3 g/dL (calc) (ref 1.9–3.7)
Glucose, Bld: 83 mg/dL (ref 65–99)
Potassium: 4.1 mmol/L (ref 3.5–5.3)
Sodium: 140 mmol/L (ref 135–146)
Total Bilirubin: 0.8 mg/dL (ref 0.2–1.2)
Total Protein: 6.4 g/dL (ref 6.1–8.1)

## 2019-05-15 LAB — CBC WITH DIFFERENTIAL/PLATELET
Absolute Monocytes: 672 cells/uL (ref 200–950)
Basophils Absolute: 79 cells/uL (ref 0–200)
Basophils Relative: 1 %
Eosinophils Absolute: 190 cells/uL (ref 15–500)
Eosinophils Relative: 2.4 %
HCT: 44.3 % (ref 38.5–50.0)
Hemoglobin: 15 g/dL (ref 13.2–17.1)
Lymphs Abs: 2204 cells/uL (ref 850–3900)
MCH: 31.5 pg (ref 27.0–33.0)
MCHC: 33.9 g/dL (ref 32.0–36.0)
MCV: 93.1 fL (ref 80.0–100.0)
MPV: 9.4 fL (ref 7.5–12.5)
Monocytes Relative: 8.5 %
Neutro Abs: 4756 cells/uL (ref 1500–7800)
Neutrophils Relative %: 60.2 %
Platelets: 254 10*3/uL (ref 140–400)
RBC: 4.76 10*6/uL (ref 4.20–5.80)
RDW: 12.1 % (ref 11.0–15.0)
Total Lymphocyte: 27.9 %
WBC: 7.9 10*3/uL (ref 3.8–10.8)

## 2019-05-15 LAB — TSH: TSH: 2.28 mIU/L (ref 0.40–4.50)

## 2019-05-15 LAB — TESTOSTERONE: Testosterone: 666 ng/dL (ref 250–827)

## 2019-07-05 NOTE — Progress Notes (Signed)
NO SHOW

## 2019-07-06 ENCOUNTER — Ambulatory Visit: Payer: BC Managed Care – PPO | Admitting: Internal Medicine

## 2019-10-19 ENCOUNTER — Other Ambulatory Visit: Payer: Self-pay | Admitting: Internal Medicine

## 2019-11-29 ENCOUNTER — Encounter: Payer: Self-pay | Admitting: Internal Medicine

## 2019-11-29 NOTE — Patient Instructions (Signed)

## 2019-11-29 NOTE — Progress Notes (Signed)
Annual  Screening/Preventative Visit  & Comprehensive Evaluation & Examination     This very nice 59 y.o. [name]  presents for a Screening /Preventative Visit & comprehensive evaluation and management of multiple medical co-morbidities.  Patient has been followed for HTN, HLD, Prediabetes, Testosterone Deficiency and Vitamin D Deficiency.     Patient reports after recently bending at the waist while twisting his torso developing a sharp pain in the left hip area & a popping sound that he can reproduce with standing & raising his left  leg which he desires to have evaluated.      HTN predates since 11. Patient's BP has been controlled at home.  Today's BP is at goal - 120/80. Patient denies any cardiac symptoms as chest pain, palpitations, shortness of breath, dizziness or ankle swelling.     Patient's hyperlipidemia is not controlled with diet. Last lipids were not at goal:  Lab Results  Component Value Date   CHOL 194 05/14/2019   HDL 54 05/14/2019   LDLCALC 118 (H) 05/14/2019   TRIG 114 05/14/2019   CHOLHDL 3.6 05/14/2019       Patient  Is overweight (BMI 26.6+) and is monitored expectantly for glucose intolerance with hx/o  prediabetes (A1c 6.1% / 2012) and patient denies reactive hypoglycemic symptoms, visual blurring, diabetic polys or paresthesias. Last A1c was Normal & at goal:  Lab Results  Component Value Date   HGBA1C 5.6 11/10/2018       Patient has hx/o Testosterone deficiency predating form 2005, but has been off for the last 6 months on Zinc alone to see how his level compares. He does admit more fatigue.     Finally, patient has history of Vitamin D Deficiency ("24" / 2008)  and last vitamin D was at goal:  Lab Results  Component Value Date   VD25OH 100 11/10/2018    Current Outpatient Medications on File Prior to Visit  Medication Sig  . aspirin 81 MG tablet Take 81 mg by mouth daily.  Marland Kitchen atenolol (TENORMIN) 100 MG tablet Take 1 tablet Daily for BP  .  Cholecalciferol (VITAMIN D3) 5000 UNITS CAPS Take by mouth daily.  . magnesium gluconate (MAGONATE) 500 MG tablet Take 500 mg by mouth daily.  . Zinc 50 MG TABS Take by mouth daily.  . B-D 3CC LUER-LOK SYR 21GX1" 21G X 1" 3 ML MISC USE AS DIRECTED (Patient not taking: Reported on 05/14/2019)  . testosterone cypionate (DEPOTESTOSTERONE CYPIONATE) 200 MG/ML injection INJECT 1.5 ML EVERY 4 WEEKS (Patient not taking: Reported on 05/14/2019)   No current facility-administered medications on file prior to visit.   Allergies  Allergen Reactions  . Viagra [Sildenafil Citrate]     palpitations   Past Medical History:  Diagnosis Date  . Glaucoma   . Hyperlipemia   . Hypertension   . Hypogonadism male   . Vitamin D deficiency    Health Maintenance  Topic Date Due  . Hepatitis C Screening  Never done  . HIV Screening  Never done  . INFLUENZA VACCINE  Never done  . TETANUS/TDAP  06/17/2021  . COLONOSCOPY  01/31/2023   Immunization History  Administered Date(s) Administered  . PPD Test 08/08/2015, 09/18/2016, 10/29/2017, 11/10/2018  . Pneumococcal-Unspecified 10/12/1997  . Td 06/30/2001  . Tdap 06/18/2011  . Zoster Recombinat (Shingrix) 01/03/2018, 05/15/2018   Last Colon - 01/30/2018 - Dr Hilarie Fredrickson - Recc 5 yr f/u due May 2024.  Past Surgical History:  Procedure Laterality Date  . COLONOSCOPY  01/2015  . EYE SURGERY Right 01/08/2018   laser for glaucoma  . FINGER SURGERY     left hand -pinky with screws  . ORIF FINGER FRACTURE  03/27/2012   Procedure: OPEN REDUCTION INTERNAL FIXATION (ORIF) METACARPAL (FINGER) FRACTURE;  Surgeon: Cammie Sickle., MD;  Location: [facility];  Service: Orthopedics;  Laterality: Left;  Left small proximal phalanx  . WISDOM TOOTH EXTRACTION     Family History  Problem Relation Age of Onset  . Hepatitis Father   . Liver cancer Father   . Diabetes Mother   . Gallstones Mother   . Breast cancer Sister   . Gallstones Brother   . Heart  disease Paternal Grandfather 75  . Colon cancer Neg Hx   . Stomach cancer Neg Hx   . Rectal cancer Neg Hx    Social History   Socioeconomic History  . Marital status: Married    Spouse name: Kathaleen Grinder)   . Number of children: 2  Occupational History  . Occupation: assembly  Tobacco Use  . Smoking status: Never Smoker  . Smokeless tobacco: Never Used  Substance and Sexual Activity  . Alcohol use: Yes    Alcohol/week: 6.0 standard drinks    Types: 6 Standard drinks or equivalent per week    Comment: 6 beer/week  . Drug use: No  . Sexual activity: Yes   ROS Constitutional: Denies fever, chills, weight loss/gain, headaches, insomnia,  night sweats or change in appetite. Does c/o fatigue. Eyes: Denies redness, blurred vision, diplopia, discharge, itchy or watery eyes.  ENT: Denies discharge, congestion, post nasal drip, epistaxis, sore throat, earache, hearing loss, dental pain, Tinnitus, Vertigo, Sinus pain or snoring.  Cardio: Denies chest pain, palpitations, irregular heartbeat, syncope, dyspnea, diaphoresis, orthopnea, PND, claudication or edema Respiratory: denies cough, dyspnea, DOE, pleurisy, hoarseness, laryngitis or wheezing.  Gastrointestinal: Denies dysphagia, heartburn, reflux, water brash, pain, cramps, nausea, vomiting, bloating, diarrhea, constipation, hematemesis, melena, hematochezia, jaundice or hemorrhoids Genitourinary: Denies dysuria, frequency, urgency, nocturia, hesitancy, discharge, hematuria or flank pain Musculoskeletal: Denies arthralgia, myalgia, stiffness, Jt. Swelling, pain, limp or strain/sprain. Denies Falls. Skin: Denies puritis, rash, hives, warts, acne, eczema or change in skin lesion Neuro: No weakness, tremor, incoordination, spasms, paresthesia or pain Psychiatric: Denies confusion, memory loss or sensory loss. Denies Depression. Endocrine: Denies change in weight, skin, hair change, nocturia, and paresthesia, diabetic polys, visual blurring or  hyper / hypo glycemic episodes.  Heme/Lymph: No excessive bleeding, bruising or enlarged lymph nodes.  Physical Exam  BP 120/80   Pulse (!) 54   Temp 97.7 F (36.5 C)   Ht 5' 8.5" (1.74 m)   Wt 177 lb 12.8 oz (80.6 kg)   SpO2 99%   BMI 26.64 kg/m   General Appearance: Well nourished and well groomed and in no apparent distress.  Eyes: PERRLA, EOMs, conjunctiva no swelling or erythema, normal fundi and vessels. Sinuses: No frontal/maxillary tenderness ENT/Mouth: EACs patent / TMs  nl. Nares clear without erythema, swelling, mucoid exudates. Oral hygiene is good. No erythema, swelling, or exudate. Tongue normal, non-obstructing. Tonsils not swollen or erythematous. Hearing normal.  Neck: Supple, thyroid not palpable. No bruits, nodes or JVD. Respiratory: Respiratory effort normal.  BS equal and clear bilateral without rales, rhonci, wheezing or stridor. Cardio: Heart sounds are normal with regular rate and rhythm and no murmurs, rubs or gallops. Peripheral pulses are normal and equal bilaterally without edema. No aortic or femoral bruits. Chest: symmetric with normal excursions and percussion.  Abdomen: Soft, with Nl bowel sounds. Nontender, no guarding, rebound, hernias, masses, or organomegaly.  Lymphatics: Non tender without lymphadenopathy.  Musculoskeletal: Full ROM all peripheral extremities, joint stability, 5/5 strength, and normal gait. Skin: Warm and dry without rashes, lesions, cyanosis, clubbing or  ecchymosis.  Neuro: Cranial nerves intact, reflexes equal bilaterally. Normal muscle tone, no cerebellar symptoms. Sensation intact.  Pysch: Alert and oriented X 3 with normal affect, insight and judgment appropriate.   Assessment and Plan  1. Annual Preventative/Screening Exam   1. Encounter for general adult medical examination with abnormal findings   2. Essential hypertension  - EKG 12-Lead - [place], RETROPERITNL ABD,  LTD - Urinalysis, Routine w reflex microscopic -  Microalbumin / creatinine urine ratio - CBC with Differential/Platelet - COMPLETE METABOLIC PANEL WITH GFR - Magnesium - TSH  3. Hyperlipidemia, mixed  - EKG 12-Lead - [place], RETROPERITNL ABD,  LTD - Lipid panel - TSH  4. Abnormal glucose  - EKG 12-Lead - [place], RETROPERITNL ABD,  LTD - Hemoglobin A1c - Insulin, random  5. Vitamin D deficiency  - VITAMIN D 25 Hydroxy  6. Prediabetes  - Hemoglobin A1c - Insulin, random  7. Testosterone Deficiency  - Testosterone  8. Screening examination for pulmonary tuberculosis  - TB Skin Test  9. Screening for colorectal cancer  - POC Hemoccult Bld/Stl   10. Prostate cancer screening  - PSA  11. Screening for ischemic heart disease  - EKG 12-Lead  12. FHx: heart disease  - EKG 12-Lead - [place], RETROPERITNL ABD,  LTD  13. Screening for AAA (aortic abdominal aneurysm)  - [place], RETROPERITNL ABD,  LTD - Iron,Total/Total Iron Binding Cap - Vitamin B12 - CBC with Differential/Platelet - TSH  14. Fatigue  - Urinalysis, Routine w reflex microscopic - Microalbumin / creatinine urine ratio - Testosterone - CBC with Differential/Platelet - COMPLETE METABOLIC PANEL WITH GFR - Magnesium - Lipid panel - TSH - Hemoglobin A1c - Insulin, random - VITAMIN D 25 Hydroxy  15. Hip pain, acute, left  - Ambulatory referral to Orthopedics         Patient was counseled in prudent diet, weight control to achieve/maintain BMI less than 25, BP monitoring, regular exercise and medications as discussed.  Discussed med effects and SE's. Routine screening labs and tests as requested with regular follow-up as recommended. Over 40 minutes of exam, counseling, chart review and high complex critical decision making was performed   Kirtland Bouchard, MD

## 2019-11-30 ENCOUNTER — Encounter: Payer: Self-pay | Admitting: Internal Medicine

## 2019-11-30 ENCOUNTER — Ambulatory Visit (INDEPENDENT_AMBULATORY_CARE_PROVIDER_SITE_OTHER): Payer: BC Managed Care – PPO | Admitting: Internal Medicine

## 2019-11-30 ENCOUNTER — Other Ambulatory Visit: Payer: Self-pay

## 2019-11-30 VITALS — BP 120/80 | HR 54 | Temp 97.7°F | Ht 68.5 in | Wt 177.8 lb

## 2019-11-30 DIAGNOSIS — Z111 Encounter for screening for respiratory tuberculosis: Secondary | ICD-10-CM

## 2019-11-30 DIAGNOSIS — M25552 Pain in left hip: Secondary | ICD-10-CM

## 2019-11-30 DIAGNOSIS — Z79899 Other long term (current) drug therapy: Secondary | ICD-10-CM

## 2019-11-30 DIAGNOSIS — I10 Essential (primary) hypertension: Secondary | ICD-10-CM

## 2019-11-30 DIAGNOSIS — Z136 Encounter for screening for cardiovascular disorders: Secondary | ICD-10-CM | POA: Diagnosis not present

## 2019-11-30 DIAGNOSIS — Z131 Encounter for screening for diabetes mellitus: Secondary | ICD-10-CM

## 2019-11-30 DIAGNOSIS — R350 Frequency of micturition: Secondary | ICD-10-CM | POA: Diagnosis not present

## 2019-11-30 DIAGNOSIS — Z0000 Encounter for general adult medical examination without abnormal findings: Secondary | ICD-10-CM | POA: Diagnosis not present

## 2019-11-30 DIAGNOSIS — E291 Testicular hypofunction: Secondary | ICD-10-CM

## 2019-11-30 DIAGNOSIS — Z1211 Encounter for screening for malignant neoplasm of colon: Secondary | ICD-10-CM

## 2019-11-30 DIAGNOSIS — Z130 Encounter for screening for diseases of the blood and blood-forming organs and certain disorders involving the immune mechanism: Secondary | ICD-10-CM

## 2019-11-30 DIAGNOSIS — Z8249 Family history of ischemic heart disease and other diseases of the circulatory system: Secondary | ICD-10-CM

## 2019-11-30 DIAGNOSIS — R7309 Other abnormal glucose: Secondary | ICD-10-CM

## 2019-11-30 DIAGNOSIS — Z125 Encounter for screening for malignant neoplasm of prostate: Secondary | ICD-10-CM | POA: Diagnosis not present

## 2019-11-30 DIAGNOSIS — Z13220 Encounter for screening for lipoid disorders: Secondary | ICD-10-CM | POA: Diagnosis not present

## 2019-11-30 DIAGNOSIS — R7303 Prediabetes: Secondary | ICD-10-CM

## 2019-11-30 DIAGNOSIS — Z1329 Encounter for screening for other suspected endocrine disorder: Secondary | ICD-10-CM | POA: Diagnosis not present

## 2019-11-30 DIAGNOSIS — E559 Vitamin D deficiency, unspecified: Secondary | ICD-10-CM

## 2019-11-30 DIAGNOSIS — N401 Enlarged prostate with lower urinary tract symptoms: Secondary | ICD-10-CM | POA: Diagnosis not present

## 2019-11-30 DIAGNOSIS — Z1389 Encounter for screening for other disorder: Secondary | ICD-10-CM | POA: Diagnosis not present

## 2019-11-30 DIAGNOSIS — E782 Mixed hyperlipidemia: Secondary | ICD-10-CM

## 2019-11-30 DIAGNOSIS — Z0001 Encounter for general adult medical examination with abnormal findings: Secondary | ICD-10-CM

## 2019-11-30 DIAGNOSIS — R5383 Other fatigue: Secondary | ICD-10-CM

## 2019-12-01 LAB — COMPLETE METABOLIC PANEL WITH GFR
AG Ratio: 1.9 (calc) (ref 1.0–2.5)
ALT: 30 U/L (ref 9–46)
AST: 19 U/L (ref 10–35)
Albumin: 4 g/dL (ref 3.6–5.1)
Alkaline phosphatase (APISO): 58 U/L (ref 35–144)
BUN: 17 mg/dL (ref 7–25)
CO2: 27 mmol/L (ref 20–32)
Calcium: 9.1 mg/dL (ref 8.6–10.3)
Chloride: 105 mmol/L (ref 98–110)
Creat: 0.82 mg/dL (ref 0.70–1.25)
GFR, Est African American: 111 mL/min/{1.73_m2} (ref >=60)
GFR, Est Non African American: 96 mL/min/{1.73_m2} (ref >=60)
Globulin: 2.1 g/dL (calc) (ref 1.9–3.7)
Glucose, Bld: 78 mg/dL (ref 65–99)
Potassium: 4 mmol/L (ref 3.5–5.3)
Sodium: 140 mmol/L (ref 135–146)
Total Bilirubin: 1 mg/dL (ref 0.2–1.2)
Total Protein: 6.1 g/dL (ref 6.1–8.1)

## 2019-12-01 LAB — URINALYSIS, ROUTINE W REFLEX MICROSCOPIC
Bilirubin Urine: NEGATIVE
Glucose, UA: NEGATIVE
Hgb urine dipstick: NEGATIVE
Ketones, ur: NEGATIVE
Leukocytes,Ua: NEGATIVE
Nitrite: NEGATIVE
Protein, ur: NEGATIVE
Specific Gravity, Urine: 1.02 (ref 1.001–1.03)
pH: <=5 (ref 5.0–8.0)

## 2019-12-01 LAB — CBC WITH DIFFERENTIAL/PLATELET
Absolute Monocytes: 705 cells/uL (ref 200–950)
Basophils Absolute: 78 cells/uL (ref 0–200)
Basophils Relative: 0.9 %
Eosinophils Absolute: 148 cells/uL (ref 15–500)
Eosinophils Relative: 1.7 %
HCT: 43.3 % (ref 38.5–50.0)
Hemoglobin: 14.7 g/dL (ref 13.2–17.1)
Lymphs Abs: 2071 cells/uL (ref 850–3900)
MCH: 32 pg (ref 27.0–33.0)
MCHC: 33.9 g/dL (ref 32.0–36.0)
MCV: 94.1 fL (ref 80.0–100.0)
MPV: 9.6 fL (ref 7.5–12.5)
Monocytes Relative: 8.1 %
Neutro Abs: 5699 cells/uL (ref 1500–7800)
Neutrophils Relative %: 65.5 %
Platelets: 275 10*3/uL (ref 140–400)
RBC: 4.6 10*6/uL (ref 4.20–5.80)
RDW: 12.1 % (ref 11.0–15.0)
Total Lymphocyte: 23.8 %
WBC: 8.7 10*3/uL (ref 3.8–10.8)

## 2019-12-01 LAB — INSULIN, RANDOM: Insulin: 9.2 u[IU]/mL

## 2019-12-01 LAB — LIPID PANEL
Cholesterol: 188 mg/dL (ref <200)
HDL: 57 mg/dL (ref >=40)
LDL Cholesterol (Calc): 112 mg/dL (calc) — ABNORMAL HIGH
Non-HDL Cholesterol (Calc): 131 mg/dL (calc) — ABNORMAL HIGH (ref <130)
Total CHOL/HDL Ratio: 3.3 (calc) (ref <5.0)
Triglycerides: 91 mg/dL (ref <150)

## 2019-12-01 LAB — HEMOGLOBIN A1C
Hgb A1c MFr Bld: 5.6 % of total Hgb (ref <5.7)
Mean Plasma Glucose: 114 (calc)
eAG (mmol/L): 6.3 (calc)

## 2019-12-01 LAB — TESTOSTERONE: Testosterone: 568 ng/dL (ref 250–827)

## 2019-12-01 LAB — MICROALBUMIN / CREATININE URINE RATIO
Creatinine, Urine: 158 mg/dL (ref 20–320)
Microalb Creat Ratio: 2 mcg/mg creat (ref <30)
Microalb, Ur: 0.3 mg/dL

## 2019-12-01 LAB — PSA: PSA: 1.3 ng/mL (ref <=4.0)

## 2019-12-01 LAB — TSH: TSH: 1.58 mIU/L (ref 0.40–4.50)

## 2019-12-01 LAB — VITAMIN B12: Vitamin B-12: 311 pg/mL (ref 200–1100)

## 2019-12-01 LAB — VITAMIN D 25 HYDROXY (VIT D DEFICIENCY, FRACTURES): Vit D, 25-Hydroxy: 104 ng/mL — ABNORMAL HIGH (ref 30–100)

## 2019-12-01 LAB — IRON, TOTAL/TOTAL IRON BINDING CAP
%SAT: 57 % (calc) — ABNORMAL HIGH (ref 20–48)
Iron: 147 ug/dL (ref 50–180)
TIBC: 257 mcg/dL (calc) (ref 250–425)

## 2019-12-01 LAB — MAGNESIUM: Magnesium: 1.8 mg/dL (ref 1.5–2.5)

## 2019-12-02 ENCOUNTER — Encounter: Payer: Self-pay | Admitting: *Deleted

## 2019-12-02 LAB — TB SKIN TEST
Induration: 0 mm
TB Skin Test: NEGATIVE

## 2020-01-11 ENCOUNTER — Other Ambulatory Visit: Payer: Self-pay | Admitting: Internal Medicine

## 2020-03-07 ENCOUNTER — Other Ambulatory Visit: Payer: Self-pay | Admitting: Internal Medicine

## 2020-03-10 ENCOUNTER — Ambulatory Visit: Payer: BC Managed Care – PPO | Admitting: Adult Health

## 2020-04-09 ENCOUNTER — Other Ambulatory Visit: Payer: Self-pay | Admitting: Adult Health

## 2020-06-14 ENCOUNTER — Encounter: Payer: Self-pay | Admitting: Internal Medicine

## 2020-06-14 ENCOUNTER — Other Ambulatory Visit: Payer: Self-pay

## 2020-06-14 ENCOUNTER — Ambulatory Visit (INDEPENDENT_AMBULATORY_CARE_PROVIDER_SITE_OTHER): Payer: BC Managed Care – PPO | Admitting: Internal Medicine

## 2020-06-14 VITALS — BP 124/66 | HR 49 | Temp 97.0°F | Resp 16 | Ht 68.5 in | Wt 179.2 lb

## 2020-06-14 DIAGNOSIS — E291 Testicular hypofunction: Secondary | ICD-10-CM

## 2020-06-14 DIAGNOSIS — E538 Deficiency of other specified B group vitamins: Secondary | ICD-10-CM

## 2020-06-14 DIAGNOSIS — R7309 Other abnormal glucose: Secondary | ICD-10-CM | POA: Diagnosis not present

## 2020-06-14 DIAGNOSIS — I10 Essential (primary) hypertension: Secondary | ICD-10-CM | POA: Diagnosis not present

## 2020-06-14 DIAGNOSIS — E782 Mixed hyperlipidemia: Secondary | ICD-10-CM | POA: Diagnosis not present

## 2020-06-14 DIAGNOSIS — Z79899 Other long term (current) drug therapy: Secondary | ICD-10-CM | POA: Diagnosis not present

## 2020-06-14 DIAGNOSIS — E559 Vitamin D deficiency, unspecified: Secondary | ICD-10-CM

## 2020-06-14 NOTE — Progress Notes (Signed)
History of Present Illness:       This very nice 59 y.o.  [name] presents for 6  month follow up with HTN, HLD, Pre-Diabetes and Vitamin D Deficiency.        Patient is treated for HTN  (1982)  & BP has been controlled at home. Today's BP is at goal - 124/66. Patient has had no complaints of any cardiac type chest pain, palpitations, dyspnea / orthopnea / PND, dizziness, claudication, or dependent edema.       Hyperlipidemia is controlled with diet. Last Lipids were not at goal:    Lab Results  Component Value Date   CHOL 188 11/30/2019   HDL 57 11/30/2019   LDLCALC 112 (H) 11/30/2019   TRIG 91 11/30/2019   CHOLHDL 3.3 11/30/2019    Also, the patient has history of PreDiabetes (A1c 6.1% /2012)  and has had no symptoms of reactive hypoglycemia, diabetic polys, paresthesias or visual blurring.  Last A1c was Normal & at goal:  Lab Results  Component Value Date   HGBA1C 5.6 11/30/2019        Patient has hx/o Testosterone deficiency (2005) and on Testosterone replacementin the past which he subsequently self d/c'd.          Further, the patient also has history of Vitamin D Deficiency ("24" /2008) and supplements vitamin D without any suspected side-effects. Last vitamin D was borderline elevated:  Lab Results  Component Value Date   VD25OH 104 (H) 11/30/2019      He also has low Vitamin B12 ("311" /Mar  2021) and was advised to take sub-lingual Vitamin B12.    Current Outpatient Medications on File Prior to Visit  Medication Sig  . aspirin 81 MG tablet Take  daily.  Marland Kitchen atenolol  100 MG tablet Take 1 tablet Daily f  . VITAMIN D 5000 UNITS CAPS Take  daily.  . magnesium 500 MG  Take  daily.  . Zinc 50 MG  Take  daily.     Allergies  Allergen Reactions  . Viagra [Sildenafil Citrate] palpitations    PMHx:   Past Medical History:  Diagnosis Date  . Glaucoma   . Hyperlipemia   . Hypertension   . Hypogonadism male   . Vitamin D deficiency     Immunization  History  Administered Date(s) Administered  . PPD Test 08/08/2015, 09/18/2016, 10/29/2017, 11/10/2018, 11/30/2019  . Pneumococcal-Unspecified 10/12/1997  . Td 06/30/2001  . Tdap 06/18/2011  . Zoster Recombinat (Shingrix) 01/03/2018, 05/15/2018    Past Surgical History:  Procedure Laterality Date  . COLONOSCOPY  01/2015  . EYE SURGERY Right 01/08/2018   laser for glaucoma  . FINGER SURGERY     left hand -pinky with screws  . ORIF FINGER FRACTURE  03/27/2012   Procedure: OPEN REDUCTION INTERNAL FIXATION (ORIF) METACARPAL (FINGER) FRACTURE;  Surgeon: Cammie Sickle., MD;  Location: [facility];  Service: Orthopedics;  Laterality: Left;  Left small proximal phalanx  . WISDOM TOOTH EXTRACTION      FHx:    Reviewed / unchanged  SHx:    Reviewed / unchanged   Systems Review:  Constitutional: Denies fever, chills, wt changes, headaches, insomnia, fatigue, night sweats, change in appetite. Eyes: Denies redness, blurred vision, diplopia, discharge, itchy, watery eyes.  ENT: Denies discharge, congestion, post nasal drip, epistaxis, sore throat, earache, hearing loss, dental pain, tinnitus, vertigo, sinus pain, snoring.  CV: Denies chest pain, palpitations, irregular heartbeat, syncope, dyspnea, diaphoresis,  orthopnea, PND, claudication or edema. Respiratory: denies cough, dyspnea, DOE, pleurisy, hoarseness, laryngitis, wheezing.  Gastrointestinal: Denies dysphagia, odynophagia, heartburn, reflux, water brash, abdominal pain or cramps, nausea, vomiting, bloating, diarrhea, constipation, hematemesis, melena, hematochezia  or hemorrhoids. Genitourinary: Denies dysuria, frequency, urgency, nocturia, hesitancy, discharge, hematuria or flank pain. Musculoskeletal: Denies arthralgias, myalgias, stiffness, jt. swelling, pain, limping or strain/sprain.  Skin: Denies pruritus, rash, hives, warts, acne, eczema or change in skin lesion(s). Neuro: No weakness, tremor, incoordination,  spasms, paresthesia or pain. Psychiatric: Denies confusion, memory loss or sensory loss. Endo: Denies change in weight, skin or hair change.  Heme/Lymph: No excessive bleeding, bruising or enlarged lymph nodes.  Physical Exam  BP 124/66   Pulse (!) 49   Temp (!) 97 F (36.1 C)   Resp 16   Ht 5' 8.5" (1.74 m)   Wt 179 lb 3.2 oz (81.3 kg)   SpO2 97%   BMI 26.85 kg/m   Appears  well nourished, well groomed  and in no distress.  Eyes: PERRLA, EOMs, conjunctiva no swelling or erythema. Sinuses: No frontal/maxillary tenderness ENT/Mouth: EAC's clear, TM's nl w/o erythema, bulging. Nares clear w/o erythema, swelling, exudates. Oropharynx clear without erythema or exudates. Oral hygiene is good. Tongue normal, non obstructing. Hearing intact.  Neck: Supple. Thyroid not palpable. Car 2+/2+ without bruits, nodes or JVD. Chest: Respirations nl with BS clear & equal w/o rales, rhonchi, wheezing or stridor.  Cor: Heart sounds normal w/ regular rate and rhythm without sig. murmurs, gallops, clicks or rubs. Peripheral pulses normal and equal  without edema.  Abdomen: Soft & bowel sounds normal. Non-tender w/o guarding, rebound, hernias, masses or organomegaly.  Lymphatics: Unremarkable.  Musculoskeletal: Full ROM all peripheral extremities, joint stability, 5/5 strength and normal gait.  Skin: Warm, dry without exposed rashes, lesions or ecchymosis apparent.  Neuro: Cranial nerves intact, reflexes equal bilaterally. Sensory-motor testing grossly intact. Tendon reflexes grossly intact.  Pysch: Alert & oriented x 3.  Insight and judgement nl & appropriate. No ideations.  Assessment and Plan:  1. Essential hypertension  - Continue medication, monitor blood pressure at home.  - Continue DASH diet.  Reminder to go to the ER if any CP,  SOB, nausea, dizziness, severe HA, changes vision/speech.  - CBC with Differential/Platelet - COMPLETE METABOLIC PANEL WITH GFR - Magnesium - TSH  2.  Hyperlipidemia, mixed  - Continue diet/meds, exercise,& lifestyle modifications.  - Continue monitor periodic cholesterol/liver & renal functions   - Lipid panel - TSH  3. Abnormal glucose  - Continue diet, exercise  - Lifestyle modifications.  - Monitor appropriate labs.  - Hemoglobin A1c - Insulin, random  4. Vitamin D deficiency  - Continue supplementation.  - VITAMIN D 25 Hydroxy   5. Testosterone Deficiency  - Testosterone  6. Vitamin B12 deficiency  - CBC with Differential/Platelet - Vitamin B12  7. Medication management  - CBC with Differential/Platelet - COMPLETE METABOLIC PANEL WITH GFR - Magnesium - Lipid panel - TSH - Hemoglobin A1c - Insulin, random - VITAMIN D 25 Hydroxy  - Testosterone - Vitamin B12        Discussed  regular exercise, BP monitoring, weight control to achieve/maintain BMI less than 25 and discussed med and SE's. Recommended labs to assess and monitor clinical status with further disposition pending results of labs.  I discussed the assessment and treatment plan with the patient. The patient was provided an opportunity to ask questions and all were answered. The patient agreed with the plan and demonstrated an  understanding of the instructions.  I provided over 30 minutes of exam, counseling, chart review and  complex critical decision making.   Kirtland Bouchard, MD

## 2020-06-14 NOTE — Patient Instructions (Signed)

## 2020-06-15 ENCOUNTER — Other Ambulatory Visit: Payer: Self-pay | Admitting: Internal Medicine

## 2020-06-15 DIAGNOSIS — E782 Mixed hyperlipidemia: Secondary | ICD-10-CM

## 2020-06-15 LAB — LIPID PANEL
Cholesterol: 185 mg/dL (ref <200)
HDL: 57 mg/dL (ref >=40)
LDL Cholesterol (Calc): 111 mg/dL (calc) — ABNORMAL HIGH
Non-HDL Cholesterol (Calc): 128 mg/dL (calc) (ref <130)
Total CHOL/HDL Ratio: 3.2 (calc) (ref <5.0)
Triglycerides: 84 mg/dL (ref <150)

## 2020-06-15 LAB — COMPLETE METABOLIC PANEL WITH GFR
AG Ratio: 1.9 (calc) (ref 1.0–2.5)
ALT: 24 U/L (ref 9–46)
AST: 19 U/L (ref 10–35)
Albumin: 4.2 g/dL (ref 3.6–5.1)
Alkaline phosphatase (APISO): 66 U/L (ref 35–144)
BUN: 18 mg/dL (ref 7–25)
CO2: 29 mmol/L (ref 20–32)
Calcium: 9.4 mg/dL (ref 8.6–10.3)
Chloride: 105 mmol/L (ref 98–110)
Creat: 0.86 mg/dL (ref 0.70–1.25)
GFR, Est African American: 108 mL/min/{1.73_m2} (ref >=60)
GFR, Est Non African American: 94 mL/min/{1.73_m2} (ref >=60)
Globulin: 2.2 g/dL (calc) (ref 1.9–3.7)
Glucose, Bld: 97 mg/dL (ref 65–99)
Potassium: 4 mmol/L (ref 3.5–5.3)
Sodium: 141 mmol/L (ref 135–146)
Total Bilirubin: 1 mg/dL (ref 0.2–1.2)
Total Protein: 6.4 g/dL (ref 6.1–8.1)

## 2020-06-15 LAB — CBC WITH DIFFERENTIAL/PLATELET
Absolute Monocytes: 684 cells/uL (ref 200–950)
Basophils Absolute: 68 cells/uL (ref 0–200)
Basophils Relative: 0.9 %
Eosinophils Absolute: 152 cells/uL (ref 15–500)
Eosinophils Relative: 2 %
HCT: 43.1 % (ref 38.5–50.0)
Hemoglobin: 14.7 g/dL (ref 13.2–17.1)
Lymphs Abs: 2196 cells/uL (ref 850–3900)
MCH: 31.7 pg (ref 27.0–33.0)
MCHC: 34.1 g/dL (ref 32.0–36.0)
MCV: 92.9 fL (ref 80.0–100.0)
MPV: 9.7 fL (ref 7.5–12.5)
Monocytes Relative: 9 %
Neutro Abs: 4499 cells/uL (ref 1500–7800)
Neutrophils Relative %: 59.2 %
Platelets: 231 10*3/uL (ref 140–400)
RBC: 4.64 10*6/uL (ref 4.20–5.80)
RDW: 12.4 % (ref 11.0–15.0)
Total Lymphocyte: 28.9 %
WBC: 7.6 10*3/uL (ref 3.8–10.8)

## 2020-06-15 LAB — TSH: TSH: 1.87 mIU/L (ref 0.40–4.50)

## 2020-06-15 LAB — HEMOGLOBIN A1C
Hgb A1c MFr Bld: 5.6 % of total Hgb (ref <5.7)
Mean Plasma Glucose: 114 (calc)
eAG (mmol/L): 6.3 (calc)

## 2020-06-15 LAB — TESTOSTERONE: Testosterone: 537 ng/dL (ref 250–827)

## 2020-06-15 LAB — VITAMIN D 25 HYDROXY (VIT D DEFICIENCY, FRACTURES): Vit D, 25-Hydroxy: 118 ng/mL — ABNORMAL HIGH (ref 30–100)

## 2020-06-15 LAB — MAGNESIUM: Magnesium: 1.9 mg/dL (ref 1.5–2.5)

## 2020-06-15 LAB — VITAMIN B12: Vitamin B-12: 297 pg/mL (ref 200–1100)

## 2020-06-15 LAB — INSULIN, RANDOM: Insulin: 8.6 u[IU]/mL

## 2020-06-15 MED ORDER — ROSUVASTATIN CALCIUM 10 MG PO TABS: ORAL_TABLET | ORAL | 3 refills | Status: DC

## 2020-06-15 MED ORDER — ROSUVASTATIN CALCIUM 10 MG PO TABS: ORAL_TABLET | ORAL | 1 refills | Status: DC

## 2020-06-15 NOTE — Progress Notes (Signed)
========================================================== ° °-   Total Chol = 185 - Borderline elevated  (Ideal or Goal is less than 180)   - And - Bad / Dangerous LDL Chol = 111 - elevated  (Ideal or Goal is less than 70  !  )   - So Chol is up to long & high risk for Heart Attack / Stroke, So...........  - Sent in new Rx to start Low dose Crestor - &   schedule 3 mo OV with Kyra to recheck  ==========================================================  - A1c - Normal - Great - No Diabetes ==========================================================  - Vitamin D = 118 is a little elevated - Ideal range is betw 70-100, so ..........  - currently on ? Vit D 5,000 u -- ? How much taking ?   ==========================================================  - Vitamin B12 is low and may be associated with Anemia, Fatigue,  Peripheral Neuropathy, Dementia and Depression   - So .................Marland Kitchen Recommend take an OTC SL or sub-lingual   Vit B12 tablet that you dissolve under your tongue (1,000 to 5,000 mcg /tab)  ==========================================================  - All Else - CBC - Kidneys - Electrolytes - Liver - Magnesium & Thyroid    - all  Normal / OK ==========================================================

## 2020-06-16 ENCOUNTER — Other Ambulatory Visit: Payer: Self-pay | Admitting: *Deleted

## 2020-06-16 DIAGNOSIS — E782 Mixed hyperlipidemia: Secondary | ICD-10-CM

## 2020-06-16 MED ORDER — ROSUVASTATIN CALCIUM 10 MG PO TABS: ORAL_TABLET | ORAL | 3 refills | Status: DC

## 2020-07-08 ENCOUNTER — Other Ambulatory Visit: Payer: Self-pay | Admitting: Internal Medicine

## 2020-08-30 ENCOUNTER — Ambulatory Visit (INDEPENDENT_AMBULATORY_CARE_PROVIDER_SITE_OTHER): Payer: BC Managed Care – PPO | Admitting: Internal Medicine

## 2020-08-30 ENCOUNTER — Other Ambulatory Visit: Payer: Self-pay

## 2020-08-30 ENCOUNTER — Encounter: Payer: Self-pay | Admitting: Internal Medicine

## 2020-08-30 VITALS — BP 136/80 | HR 71 | Temp 97.2°F | Resp 16 | Ht 68.0 in | Wt 180.6 lb

## 2020-08-30 DIAGNOSIS — N401 Enlarged prostate with lower urinary tract symptoms: Secondary | ICD-10-CM

## 2020-08-30 NOTE — Progress Notes (Signed)
. ° °  History of Present Illness:      Patient is a 59 yo [name] with HTN and HDL  (stopped his Crestor ? Abd pain) who presents with 6 - 8 week hx/o frequent urination and N q2h. Denies dysuria   Medications   Current Outpatient Medications (Cardiovascular):    atenolol (TENORMIN) 100 MG tablet, Take     1 tablet     Daily      for BP   rosuvastatin (CRESTOR) 10 MG tablet, Take 1 tablet Daily for Cholesterol   Current Outpatient Medications (Analgesics):    aspirin 81 MG tablet, Take 81 mg by mouth daily.   Current Outpatient Medications (Other):    Cholecalciferol (VITAMIN D3) 5000 UNITS CAPS, Take by mouth daily.   magnesium gluconate (MAGONATE) 500 MG tablet, Take 500 mg by mouth daily.   Zinc 50 MG TABS, Take by mouth daily.  Problem list He has Hypertension; Testosterone Deficiency; Vitamin D deficiency; Abnormal glucose; Hyperlipidemia; and Medication management on their problem list.   Observations/Objective:  BP 136/80    Pulse 71    Temp (!) 97.2 F (36.2 C)    Resp 16    Ht 5\' 8"  (1.727 m)    Wt 180 lb 9.6 oz (81.9 kg)    SpO2 97%    BMI 27.46 kg/m   HEENT - WNL. Neck - supple.  Chest - Clear equal BS. Cor - Nl HS. RRR w/o sig MGR. PP 1(+). No edema. MS- FROM w/o deformities.  Gait Nl. GU - Prostate enlarged  And firm.  Neuro -  Nl w/o focal abnormalities.  Assessment and Plan:   1. Benign prostatic hyperplasia with lower urinary tract symptoms, symptom details unspecified  - Urinalysis, Routine w reflex microscopic - Urine Culture - PSA  - Rx  -  Tamsulosin 0.4 mg # 90  Take 1 tablet at Bedtime.   Follow Up Instructions:       I discussed the assessment and treatment plan with the patient. The patient was provided an opportunity to ask questions and all were answered. The patient agreed with the plan and demonstrated an understanding of the instructions.         The patient was advised to call back or seek an in-person evaluation if the symptoms  worsen or if the condition fails to improve as anticipated.    Kirtland Bouchard, MD

## 2020-08-31 ENCOUNTER — Other Ambulatory Visit: Payer: Self-pay | Admitting: Internal Medicine

## 2020-08-31 LAB — URINALYSIS, ROUTINE W REFLEX MICROSCOPIC
Bilirubin Urine: NEGATIVE
Glucose, UA: NEGATIVE
Hgb urine dipstick: NEGATIVE
Ketones, ur: NEGATIVE
Leukocytes,Ua: NEGATIVE
Nitrite: NEGATIVE
Protein, ur: NEGATIVE
Specific Gravity, Urine: 1.019 (ref 1.001–1.03)
pH: 6 (ref 5.0–8.0)

## 2020-08-31 LAB — URINE CULTURE
MICRO NUMBER:: 11317632
Result:: NO GROWTH
SPECIMEN QUALITY:: ADEQUATE

## 2020-08-31 LAB — PSA: PSA: 1.6 ng/mL (ref <=4.0)

## 2020-08-31 MED ORDER — TAMSULOSIN HCL 0.4 MG PO CAPS: ORAL_CAPSULE | ORAL | 3 refills | Status: DC

## 2020-08-31 NOTE — Progress Notes (Signed)
========================================================== °-   Test results slightly outside the reference range are not unusual. If there is anything important, I will review this with you,  otherwise it is considered normal test values.  If you have further questions,  please do not hesitate to contact me at the office or via My Chart.  ==========================================================  -  U/A - OK  - No sign of Infection and PSA - Normal  - No sign of Prostate Cancer !  - So,  [facility] (Tamsulosin) &  hopefully will improve symptoms ==========================================================

## 2020-08-31 NOTE — Progress Notes (Signed)
=====================================  -   Culture returned - Negative = No Infection =====================================

## 2020-09-03 ENCOUNTER — Encounter: Payer: Self-pay | Admitting: Internal Medicine

## 2020-09-03 NOTE — Patient Instructions (Signed)

## 2020-09-22 ENCOUNTER — Ambulatory Visit: Payer: BC Managed Care – PPO | Admitting: Adult Health Nurse Practitioner

## 2020-10-02 ENCOUNTER — Other Ambulatory Visit: Payer: Self-pay | Admitting: Internal Medicine

## 2020-12-06 ENCOUNTER — Encounter: Payer: Self-pay | Admitting: Internal Medicine

## 2020-12-06 NOTE — Progress Notes (Signed)
Annual  Screening/Preventative Visit  & Comprehensive Evaluation & Examination                                             This very nice 60 y.o. [name] presents for a Screening / Preventative Visit & comprehensive evaluation and management of multiple medical co-morbidities.  Patient has been followed for HTN, HLD, Prediabetes, Testosterone Deficiency and Vitamin D Deficiency.      HTN predates circa 1982 . Patient's BP has been controlled at home.  Today's BP is at goal - 132/74. Patient denies any cardiac symptoms as chest pain, palpitations, shortness of breath, dizziness or ankle swelling.      Patient's hyperlipidemia was not  controlled with diet and he was prescribed Rosuvastatin. Patient reports myalgias causing him to stop the meds. Last lipids were not at goal when patient was off of his Rosuvastatin:  Lab Results  Component Value Date   CHOL 185 06/14/2020   HDL 57 06/14/2020   LDLCALC 111 (H) 06/14/2020   TRIG 84 06/14/2020   CHOLHDL 3.2 06/14/2020        Patient  is overweight (BMI 26.6+) and is monitored expectantly for glucose intolerance.  Patient does have hx/o  prediabetes (A1c 6.1% /2012) and patient denies reactive hypoglycemic symptoms, visual blurring, diabetic polys or paresthesias. Last A1c was normal & at goal:   Lab Results  Component Value Date   HGBA1C 5.6 06/14/2020                                             Patient has hx/o Testosterone deficiency predating since 2005, but had stopped replacement  been off for the last 6 months on Zinc alone with his Testosterone level rising to normal range ("568").       Finally, patient has history of Vitamin D Deficiency ("24" /2008) and last vitamin D was sl elevated & dose was decreased:    Lab Results  Component Value Date   VD25OH 118 (H) 06/14/2020    Current Outpatient Medications on File Prior to Visit  Medication Sig   aspirin 81 MG tablet Take daily.   atenolol 100 MG tablet TAKE 1 TABLET  DAILY    VITAMIN D 5000 u Take  daily.   magnesium  500 MG tablet Take  daily.   Zinc 50 MG TABS Take  daily.   rosuvastatin  10 MG tablet Take 1 tablet Daily for Cholesterol (Patient not taking)     Allergies  Allergen Reactions   Viagra [Sildenafil Citrate]     palpitations    Past Medical History:  Diagnosis Date   Glaucoma    Hyperlipemia    Hypertension    Hypogonadism male    Vitamin D deficiency     Health Maintenance  Topic Date Due   Hepatitis C Screening  Never done   HIV Screening  Never done   INFLUENZA VACCINE  Never done   TETANUS/TDAP  06/17/2021   COLONOSCOPY  01/31/2023   COVID-19 Vaccine  Completed   HPV VACCINES  Aged Out    Immunization History  Administered Date(s) Administered   PFIZERSARS-COV-2 Vaccination 12/13/2019, 01/03/2020, 09/23/2020   PPD Test 08/08/2015, 09/18/2016, 10/29/2017, 11/10/2018, 11/30/2019   Pneumococcal-23 10/12/1997   Td  06/30/2001   Tdap 06/18/2011   Zoster Recombinat (Shingrix) 01/03/2018, 01/03/2018, 05/15/2018, 05/15/2018    Last Colon - 01/30/2018 -Dr Hilarie Fredrickson - Recc  5 yr  f/u due May 2024.   Past Surgical History:  Procedure Laterality Date   COLONOSCOPY  01/2015   EYE SURGERY Right 01/08/2018   laser for glaucoma   FINGER SURGERY     left hand -pinky with screws   ORIF FINGER FRACTURE  03/27/2012   Procedure: OPEN REDUCTION INTERNAL FIXATION (ORIF) METACARPAL (FINGER) FRACTURE;  Surgeon: Cammie Sickle., MD;  Location: [facility];  Service: Orthopedics;  Laterality: Left;  Left small proximal phalanx   WISDOM TOOTH EXTRACTION      Family History  Problem Relation Age of Onset   Hepatitis Father    Liver cancer Father    Diabetes Mother    Gallstones Mother    Breast cancer Sister    Gallstones Brother    Heart disease Paternal Grandfather 63   Colon cancer Neg Hx    Stomach cancer Neg Hx    Rectal cancer Neg Hx     Social History    Socioeconomic History   Marital status: Married    Spouse name: Joelene Millin Maudie Mercury)    Number of children: 2  Occupational History   Occupation: assembly  Tobacco Use   Smoking status: Never Smoker   Smokeless tobacco: Never Used  Vaping Use   Vaping Use: Never used  Substance and Sexual Activity   Alcohol use: Yes    Alcohol/week: 6.0 standard drinks    Types: 6 Standard drinks or equivalent per week    Comment: 6 beer/week   Drug use: No   Sexual activity: Yes    ROS Constitutional: Denies fever, chills, weight loss/gain, headaches, insomnia,  night sweats or change in appetite. Does c/o fatigue. Eyes: Denies redness, blurred vision, diplopia, discharge, itchy or watery eyes.  ENT: Denies discharge, congestion, post nasal drip, epistaxis, sore throat, earache, hearing loss, dental pain, Tinnitus, Vertigo, Sinus pain or snoring.  Cardio: Denies chest pain, palpitations, irregular heartbeat, syncope, dyspnea, diaphoresis, orthopnea, PND, claudication or edema Respiratory: denies cough, dyspnea, DOE, pleurisy, hoarseness, laryngitis or wheezing.  Gastrointestinal: Denies dysphagia, heartburn, reflux, water brash, pain, cramps, nausea, vomiting, bloating, diarrhea, constipation, hematemesis, melena, hematochezia, jaundice or hemorrhoids Genitourinary: Denies dysuria, frequency, urgency, nocturia, hesitancy, discharge, hematuria or flank pain Musculoskeletal: Denies arthralgia, myalgia, stiffness, Jt. Swelling, pain, limp or strain/sprain. Denies Falls. Skin: Denies puritis, rash, hives, warts, acne, eczema or change in skin lesion Neuro: No weakness, tremor, incoordination, spasms, paresthesia or pain Psychiatric: Denies confusion, memory loss or sensory loss. Denies Depression. Endocrine: Denies change in weight, skin, hair change, nocturia, and paresthesia, diabetic polys, visual blurring or hyper / hypo glycemic episodes.  Heme/Lymph: No excessive bleeding, bruising or  enlarged lymph nodes.  Physical Exam  BP 132/74    Pulse (!) 47    Temp (!) 97.5 F (36.4 C)    Resp 16    Ht 5\' 8"  (1.727 m)    Wt 180 lb 12.8 oz (82 kg)    SpO2 99%    BMI 27.49 kg/m   General Appearance: Well nourished and well groomed and in no apparent distress.  Eyes: PERRLA, EOMs, conjunctiva no swelling or erythema, normal fundi and vessels. Sinuses: No frontal/maxillary tenderness ENT/Mouth: EACs patent / TMs  nl. Nares clear without erythema, swelling, mucoid exudates. Oral hygiene is good. No erythema, swelling, or exudate. Tongue normal, non-obstructing. Tonsils not  swollen or erythematous. Hearing normal.  Neck: Supple, thyroid not palpable. No bruits, nodes or JVD. Respiratory: Respiratory effort normal.  BS equal and clear bilateral without rales, rhonci, wheezing or stridor. Cardio: Heart sounds are normal with regular rate and rhythm and no murmurs, rubs or gallops. Peripheral pulses are normal and equal bilaterally without edema. No aortic or femoral bruits. Chest: symmetric with normal excursions and percussion.  Abdomen: Soft, with Nl bowel sounds. Nontender, no guarding, rebound, hernias, masses, or organomegaly.  Lymphatics: Non tender without lymphadenopathy.  Musculoskeletal: Full ROM all peripheral extremities, joint stability, 5/5 strength, and normal gait. Skin: Warm and dry without rashes, lesions, cyanosis, clubbing or  ecchymosis.  Neuro: Cranial nerves intact, reflexes equal bilaterally. Normal muscle tone, no cerebellar symptoms. Sensation intact.  Pysch: Alert and oriented x 3 with normal affect, insight and judgment appropriate.   Assessment and Plan  1. Annual Preventative/Screening Exam    2. Essential hypertension  - EKG 12-Lead - [place], RETROPERITNL ABD,  LTD - Urinalysis, Routine w reflex microscopic - Microalbumin / creatinine urine ratio - CBC with Differential/Platelet - COMPLETE METABOLIC PANEL WITH GFR - Magnesium - TSH  3.  Hyperlipidemia, mixed  - EKG 12-Lead - [place], RETROPERITNL ABD,  LTD - Lipid panel - TSH  4. Abnormal glucose  - EKG 12-Lead - [place], RETROPERITNL ABD,  LTD - Hemoglobin A1c - Insulin, random  5. Vitamin D deficiency  - VITAMIN D 25 Hydroxy   6. Testosterone Deficiency  - Testosterone  7. Vitamin B12 deficiency  - Vitamin B12  8. Screening examination for pulmonary tuberculosis  - TB Skin Test  9. Screening for colorectal cancer  - POC Hemoccult Bld/Stl   10. Prostate cancer screening  - PSA  11. Screening for ischemic heart disease  - EKG 12-Lead  12. FHx: heart disease  - EKG 12-Lead - [place], RETROPERITNL ABD,  LTD  13. Screening for AAA (aortic abdominal aneurysm)  - [place], RETROPERITNL ABD,  LTD  14. Fatigue, unspecified type  - Iron,Total/Total Iron Binding Cap - Vitamin B12 - CBC with Differential/Platelet - TSH - Testosterone  15. Medication management  - Urinalysis, Routine w reflex microscopic - Microalbumin / creatinine urine ratio - CBC with Differential/Platelet - COMPLETE METABOLIC PANEL WITH GFR - Magnesium - Lipid panel - TSH - Hemoglobin A1c - Insulin, random - VITAMIN D 25 Hydroxy          Patient was counseled in prudent diet, weight control to achieve/maintain BMI less than 25, BP monitoring, regular exercise and medications as discussed.  Discussed med effects and SE's. Routine screening labs and tests as requested with regular follow-up as recommended. Over 40 minutes of exam, counseling, chart review and high complex critical decision making was performed   Kirtland Bouchard, MD

## 2020-12-06 NOTE — Patient Instructions (Signed)

## 2020-12-07 ENCOUNTER — Ambulatory Visit (INDEPENDENT_AMBULATORY_CARE_PROVIDER_SITE_OTHER): Payer: BC Managed Care – PPO | Admitting: Internal Medicine

## 2020-12-07 ENCOUNTER — Other Ambulatory Visit: Payer: Self-pay

## 2020-12-07 VITALS — BP 132/74 | HR 47 | Temp 97.5°F | Resp 16 | Ht 68.0 in | Wt 180.8 lb

## 2020-12-07 DIAGNOSIS — Z125 Encounter for screening for malignant neoplasm of prostate: Secondary | ICD-10-CM

## 2020-12-07 DIAGNOSIS — N401 Enlarged prostate with lower urinary tract symptoms: Secondary | ICD-10-CM | POA: Diagnosis not present

## 2020-12-07 DIAGNOSIS — E559 Vitamin D deficiency, unspecified: Secondary | ICD-10-CM | POA: Diagnosis not present

## 2020-12-07 DIAGNOSIS — Z1329 Encounter for screening for other suspected endocrine disorder: Secondary | ICD-10-CM | POA: Diagnosis not present

## 2020-12-07 DIAGNOSIS — Z8249 Family history of ischemic heart disease and other diseases of the circulatory system: Secondary | ICD-10-CM

## 2020-12-07 DIAGNOSIS — Z1211 Encounter for screening for malignant neoplasm of colon: Secondary | ICD-10-CM

## 2020-12-07 DIAGNOSIS — R350 Frequency of micturition: Secondary | ICD-10-CM | POA: Diagnosis not present

## 2020-12-07 DIAGNOSIS — Z130 Encounter for screening for diseases of the blood and blood-forming organs and certain disorders involving the immune mechanism: Secondary | ICD-10-CM

## 2020-12-07 DIAGNOSIS — E538 Deficiency of other specified B group vitamins: Secondary | ICD-10-CM

## 2020-12-07 DIAGNOSIS — Z111 Encounter for screening for respiratory tuberculosis: Secondary | ICD-10-CM | POA: Diagnosis not present

## 2020-12-07 DIAGNOSIS — Z1389 Encounter for screening for other disorder: Secondary | ICD-10-CM | POA: Diagnosis not present

## 2020-12-07 DIAGNOSIS — Z79899 Other long term (current) drug therapy: Secondary | ICD-10-CM

## 2020-12-07 DIAGNOSIS — I10 Essential (primary) hypertension: Secondary | ICD-10-CM | POA: Diagnosis not present

## 2020-12-07 DIAGNOSIS — Z0000 Encounter for general adult medical examination without abnormal findings: Secondary | ICD-10-CM

## 2020-12-07 DIAGNOSIS — E291 Testicular hypofunction: Secondary | ICD-10-CM

## 2020-12-07 DIAGNOSIS — R7309 Other abnormal glucose: Secondary | ICD-10-CM

## 2020-12-07 DIAGNOSIS — Z13220 Encounter for screening for lipoid disorders: Secondary | ICD-10-CM

## 2020-12-07 DIAGNOSIS — Z0001 Encounter for general adult medical examination with abnormal findings: Secondary | ICD-10-CM

## 2020-12-07 DIAGNOSIS — Z136 Encounter for screening for cardiovascular disorders: Secondary | ICD-10-CM | POA: Diagnosis not present

## 2020-12-07 DIAGNOSIS — Z131 Encounter for screening for diabetes mellitus: Secondary | ICD-10-CM

## 2020-12-07 DIAGNOSIS — R5383 Other fatigue: Secondary | ICD-10-CM

## 2020-12-07 DIAGNOSIS — E782 Mixed hyperlipidemia: Secondary | ICD-10-CM

## 2020-12-07 NOTE — Progress Notes (Signed)
AortaScan < 3 cm. Within normal limits, per Dr McKeown. 

## 2020-12-08 LAB — COMPLETE METABOLIC PANEL WITH GFR
AG Ratio: 1.7 (calc) (ref 1.0–2.5)
ALT: 32 U/L (ref 9–46)
AST: 21 U/L (ref 10–35)
Albumin: 4.2 g/dL (ref 3.6–5.1)
Alkaline phosphatase (APISO): 65 U/L (ref 35–144)
BUN: 14 mg/dL (ref 7–25)
CO2: 29 mmol/L (ref 20–32)
Calcium: 9.5 mg/dL (ref 8.6–10.3)
Chloride: 103 mmol/L (ref 98–110)
Creat: 0.91 mg/dL (ref 0.70–1.25)
GFR, Est African American: 105 mL/min/{1.73_m2} (ref >=60)
GFR, Est Non African American: 91 mL/min/{1.73_m2} (ref >=60)
Globulin: 2.5 g/dL (calc) (ref 1.9–3.7)
Glucose, Bld: 76 mg/dL (ref 65–99)
Potassium: 4.1 mmol/L (ref 3.5–5.3)
Sodium: 140 mmol/L (ref 135–146)
Total Bilirubin: 0.8 mg/dL (ref 0.2–1.2)
Total Protein: 6.7 g/dL (ref 6.1–8.1)

## 2020-12-08 LAB — CBC WITH DIFFERENTIAL/PLATELET
Absolute Monocytes: 748 cells/uL (ref 200–950)
Basophils Absolute: 77 cells/uL (ref 0–200)
Basophils Relative: 0.9 %
Eosinophils Absolute: 198 cells/uL (ref 15–500)
Eosinophils Relative: 2.3 %
HCT: 44.7 % (ref 38.5–50.0)
Hemoglobin: 15.2 g/dL (ref 13.2–17.1)
Lymphs Abs: 2305 cells/uL (ref 850–3900)
MCH: 31.7 pg (ref 27.0–33.0)
MCHC: 34 g/dL (ref 32.0–36.0)
MCV: 93.3 fL (ref 80.0–100.0)
MPV: 9.7 fL (ref 7.5–12.5)
Monocytes Relative: 8.7 %
Neutro Abs: 5272 cells/uL (ref 1500–7800)
Neutrophils Relative %: 61.3 %
Platelets: 253 10*3/uL (ref 140–400)
RBC: 4.79 10*6/uL (ref 4.20–5.80)
RDW: 12.2 % (ref 11.0–15.0)
Total Lymphocyte: 26.8 %
WBC: 8.6 10*3/uL (ref 3.8–10.8)

## 2020-12-08 LAB — MAGNESIUM: Magnesium: 1.9 mg/dL (ref 1.5–2.5)

## 2020-12-08 LAB — LIPID PANEL
Cholesterol: 173 mg/dL (ref <200)
HDL: 59 mg/dL (ref >=40)
LDL Cholesterol (Calc): 98 mg/dL (calc)
Non-HDL Cholesterol (Calc): 114 mg/dL (calc) (ref <130)
Total CHOL/HDL Ratio: 2.9 (calc) (ref <5.0)
Triglycerides: 73 mg/dL (ref <150)

## 2020-12-08 LAB — URINALYSIS, ROUTINE W REFLEX MICROSCOPIC
Bilirubin Urine: NEGATIVE
Glucose, UA: NEGATIVE
Hgb urine dipstick: NEGATIVE
Ketones, ur: NEGATIVE
Leukocytes,Ua: NEGATIVE
Nitrite: NEGATIVE
Protein, ur: NEGATIVE
Specific Gravity, Urine: 1.011 (ref 1.001–1.03)
pH: <=5 (ref 5.0–8.0)

## 2020-12-08 LAB — MICROALBUMIN / CREATININE URINE RATIO
Creatinine, Urine: 70 mg/dL (ref 20–320)
Microalb Creat Ratio: 3 mcg/mg creat (ref <30)
Microalb, Ur: 0.2 mg/dL

## 2020-12-08 LAB — HEMOGLOBIN A1C
Hgb A1c MFr Bld: 5.6 % of total Hgb (ref <5.7)
Mean Plasma Glucose: 114 mg/dL
eAG (mmol/L): 6.3 mmol/L

## 2020-12-08 LAB — TESTOSTERONE: Testosterone: 619 ng/dL (ref 250–827)

## 2020-12-08 LAB — VITAMIN B12: Vitamin B-12: 277 pg/mL (ref 200–1100)

## 2020-12-08 LAB — IRON, TOTAL/TOTAL IRON BINDING CAP
%SAT: 55 % (calc) — ABNORMAL HIGH (ref 20–48)
Iron: 157 ug/dL (ref 50–180)
TIBC: 283 mcg/dL (calc) (ref 250–425)

## 2020-12-08 LAB — PSA: PSA: 1.38 ng/mL (ref <=4.0)

## 2020-12-08 LAB — TSH: TSH: 1.64 mIU/L (ref 0.40–4.50)

## 2020-12-08 LAB — VITAMIN D 25 HYDROXY (VIT D DEFICIENCY, FRACTURES): Vit D, 25-Hydroxy: 93 ng/mL (ref 30–100)

## 2020-12-08 LAB — INSULIN, RANDOM: Insulin: 7.3 u[IU]/mL

## 2020-12-08 NOTE — Progress Notes (Signed)
============================================================ -   Test results slightly outside the reference range are not unusual. If there is anything important, I will review this with you,  otherwise it is considered normal test values.  If you have further questions,  please do not hesitate to contact me at the office or via My Chart.  ============================================================ ============================================================  -  Iron Levels - Normal & OK  ============================================================ ============================================================  -  Vitamin B12   =   277    Very Low  !    !    !    ! (Ideal or Goal Vit B12 is between 450 - 1,100)   Low Vit B12 may be associated with Anemia , Fatigue,   Peripheral Neuropathy, Dementia, "Brain Fog", & Depression  - Recommend take a sub-lingual form of Vitamin B12 tablet   1,000 to 5,000 mcg tab that you dissolve under your tongue /Daily   - Can get Baron Sane - best price at [employer] or on [employer] ============================================================ ============================================================  -  PSA  - Low - Great ! ============================================================ ============================================================  -  Total Chol = 173  and LDL Chol = 98  - Both  Good   - Low risk for Heart Attack  / Stroke ============================================================ ============================================================  -  A1c  -  Normal  -   No Diabetes  -  Great   ! ============================================================ ============================================================  -  Vitamin  D = 93  - Great  ============================================================ ============================================================  -  Testosterone - Normal   ============================================================ ============================================================  -  All Else - CBC - Kidneys - U/A - Electrolytes - Liver - Magnesium & Thyroid    - all  Normal / OK ============================================================   - All looks Good except worry about getting DEMENTIA from Low Vitamin B12  !  ============================================================ ============================================================

## 2020-12-26 ENCOUNTER — Other Ambulatory Visit: Payer: Self-pay | Admitting: Adult Health Nurse Practitioner

## 2021-06-20 ENCOUNTER — Ambulatory Visit (INDEPENDENT_AMBULATORY_CARE_PROVIDER_SITE_OTHER): Payer: BC Managed Care – PPO | Admitting: Internal Medicine

## 2021-06-20 ENCOUNTER — Encounter: Payer: Self-pay | Admitting: Internal Medicine

## 2021-06-20 ENCOUNTER — Other Ambulatory Visit: Payer: Self-pay

## 2021-06-20 ENCOUNTER — Ambulatory Visit: Payer: BC Managed Care – PPO | Admitting: Adult Health

## 2021-06-20 VITALS — BP 134/74 | HR 56 | Temp 97.5°F | Resp 12 | Ht 68.0 in | Wt 179.6 lb

## 2021-06-20 DIAGNOSIS — E782 Mixed hyperlipidemia: Secondary | ICD-10-CM

## 2021-06-20 DIAGNOSIS — R7309 Other abnormal glucose: Secondary | ICD-10-CM | POA: Diagnosis not present

## 2021-06-20 DIAGNOSIS — Z79899 Other long term (current) drug therapy: Secondary | ICD-10-CM

## 2021-06-20 DIAGNOSIS — I10 Essential (primary) hypertension: Secondary | ICD-10-CM | POA: Diagnosis not present

## 2021-06-20 DIAGNOSIS — E559 Vitamin D deficiency, unspecified: Secondary | ICD-10-CM | POA: Diagnosis not present

## 2021-06-20 NOTE — Patient Instructions (Signed)

## 2021-06-20 NOTE — Progress Notes (Signed)
Future Appointments  Date Time Provider State Line  06/20/2021  2:30 PM Unk Pinto, MD [facility] None  12/13/2021  3:00 PM Unk Pinto, MD [facility] None    History of Present Illness:       This very nice 60 y.o. [name] presents for 6 month follow up with HTN, HLD, Pre-Diabetes, Testosterone Deficiency  and Vitamin D Deficiency.        Patient is treated for HTN (1982) & BP has been controlled at home. Today's BP is at goal -  134/74. Patient has had no complaints of any cardiac type chest pain, palpitations, dyspnea / orthopnea / PND, dizziness, claudication, or dependent edema.       Hyperlipidemia is controlled with diet & meds. Patient denies myalgias or other med SE's. Last Lipids were at goal:  Lab Results  Component Value Date   CHOL 173 12/07/2020   HDL 59 12/07/2020   LDLCALC 98 12/07/2020   TRIG 73 12/07/2020   CHOLHDL 2.9 12/07/2020     Also, the patient has is proactively monitored for glucose intolerance  and has had no symptoms of reactive hypoglycemia, diabetic polys, paresthesias or visual blurring.  Last A1c was normal & at goal:  Lab Results  Component Value Date   HGBA1C 5.6 12/07/2020        Further, the patient also has history of Vitamin D Deficiency ("24" /2008) and supplements vitamin D without any suspected side-effects. Last vitamin D was at goal:  Lab Results  Component Value Date   VD25OH 93 12/07/2020     Current Outpatient Medications on File Prior to Visit  Medication Sig   aspirin 81 MG tablet Take  daily.   atenolol 100 MG tablet TAKE 1 TABLET  EVERY DAY   VITAMIN D 5000 UNITS CAPS Take daily.   magnesium  500 MG tablet Take  daily.   (Patient not taking: No sig reported)   Zinc 50 MG TABS Take  daily.     Allergies  Allergen Reactions   Viagra [Sildenafil Citrate] palpitations    PMHx:   Past Medical History:  Diagnosis Date   Glaucoma    Hyperlipemia    Hypertension    Hypogonadism male    Vitamin  D deficiency      Immunization History  Administered Date(s) Administered   PFIZER SARS-COV-2 Vacc 12/13/2019, 01/03/2020, 09/23/2020   PPD Test 11/10/2018, 11/30/2019, 12/07/2020   Pneumococcal-23 10/12/1997   Td 06/30/2001   Tdap 06/18/2011   Zoster Recombinat (Shingrix) 01/03/2018, 05/15/2018,     Past Surgical History:  Procedure Laterality Date   COLONOSCOPY  01/2015   EYE SURGERY Right 01/08/2018   laser for glaucoma   FINGER SURGERY     left hand -pinky with screws   ORIF FINGER FRACTURE  03/27/2012   Procedure: OPEN REDUCTION INTERNAL FIXATION (ORIF) METACARPAL (FINGER) FRACTURE;  Surgeon: Cammie Sickle., MD;  Location: [place];  Service: Orthopedics;  Laterality: Left;  Left small proximal phalanx   WISDOM TOOTH EXTRACTION      FHx:    Reviewed / unchanged  SHx:    Reviewed / unchanged   Systems Review:  Constitutional: Denies fever, chills, wt changes, headaches, insomnia, fatigue, night sweats, change in appetite. Eyes: Denies redness, blurred vision, diplopia, discharge, itchy, watery eyes.  ENT: Denies discharge, congestion, post nasal drip, epistaxis, sore throat, earache, hearing loss, dental pain, tinnitus, vertigo, sinus pain, snoring.  CV: Denies chest pain, palpitations, irregular heartbeat, syncope, dyspnea, diaphoresis,  orthopnea, PND, claudication or edema. Respiratory: denies cough, dyspnea, DOE, pleurisy, hoarseness, laryngitis, wheezing.  Gastrointestinal: Denies dysphagia, odynophagia, heartburn, reflux, water brash, abdominal pain or cramps, nausea, vomiting, bloating, diarrhea, constipation, hematemesis, melena, hematochezia  or hemorrhoids. Genitourinary: Denies dysuria, frequency, urgency, nocturia, hesitancy, discharge, hematuria or flank pain. Musculoskeletal: Denies arthralgias, myalgias, stiffness, jt. swelling, pain, limping or strain/sprain.  Skin: Denies pruritus, rash, hives, warts, acne, eczema or change in skin  lesion(s). Neuro: No weakness, tremor, incoordination, spasms, paresthesia or pain. Psychiatric: Denies confusion, memory loss or sensory loss. Endo: Denies change in weight, skin or hair change.  Heme/Lymph: No excessive bleeding, bruising or enlarged lymph nodes.  Physical Exam  BP 134/74   Pulse (!) 56   Temp (!) 97.5 F (36.4 C)   Resp 12   Ht 5\' 8"  (1.727 m)   Wt 179 lb 9.6 oz (81.5 kg)   SpO2 99%   BMI 27.31 kg/m   Appears  well nourished, well groomed  and in no distress.  Eyes: PERRLA, EOMs, conjunctiva no swelling or erythema. Sinuses: No frontal/maxillary tenderness ENT/Mouth: EAC's clear, TM's nl w/o erythema, bulging. Nares clear w/o erythema, swelling, exudates. Oropharynx clear without erythema or exudates. Oral hygiene is good. Tongue normal, non obstructing. Hearing intact.  Neck: Supple. Thyroid not palpable. Car 2+/2+ without bruits, nodes or JVD. Chest: Respirations nl with BS clear & equal w/o rales, rhonchi, wheezing or stridor.  Cor: Heart sounds normal w/ regular rate and rhythm without sig. murmurs, gallops, clicks or rubs. Peripheral pulses normal and equal  without edema.  Abdomen: Soft & bowel sounds normal. Non-tender w/o guarding, rebound, hernias, masses or organomegaly.  Lymphatics: Unremarkable.  Musculoskeletal: Full ROM all peripheral extremities, joint stability, 5/5 strength and normal gait.  Skin: Warm, dry without exposed rashes, lesions or ecchymosis apparent.  Neuro: Cranial nerves intact, reflexes equal bilaterally. Sensory-motor testing grossly intact. Tendon reflexes grossly intact.  Pysch: Alert & oriented x 3.  Insight and judgement nl & appropriate. No ideations.  Assessment and Plan:  1. Essential hypertension  - Continue medication, monitor blood pressure at home.  - Continue DASH diet.  Reminder to go to the ER if any CP,  SOB, nausea, dizziness, severe HA, changes vision/speech.   - CBC with Differential/Platelet - COMPLETE  METABOLIC PANEL WITH GFR - Magnesium - TSH  2. Hyperlipidemia, mixed  - Continue diet/meds, exercise,& lifestyle modifications.  - Continue monitor periodic cholesterol/liver & renal functions    - Lipid panel - TSH  3. Abnormal glucose  - Continue diet, exercise  - Lifestyle modifications.  - Monitor appropriate labs   - Hemoglobin A1c - Insulin, random  4. Vitamin D deficiency  - Continue supplementation.   - VITAMIN D 25 Hydroxy  5. Medication management  - CBC with Differential/Platelet - COMPLETE METABOLIC PANEL WITH GFR - Magnesium - Lipid panel - TSH - Hemoglobin A1c - Insulin, random - VITAMIN D 25 Hydroxy          Discussed  regular exercise, BP monitoring, weight control to achieve/maintain BMI less than 25 and discussed med and SE's. Recommended labs to assess and monitor clinical status with further disposition pending results of labs.  I discussed the assessment and treatment plan with the patient. The patient was provided an opportunity to ask questions and all were answered. The patient agreed with the plan and demonstrated an understanding of the instructions.  I provided over 30 minutes of exam, counseling, chart review and  complex critical decision making.  The patient was advised to call back or seek an in-person evaluation if the symptoms worsen or if the condition fails to improve as anticipated.   Kirtland Bouchard, MD

## 2021-06-21 LAB — COMPLETE METABOLIC PANEL WITH GFR
AG Ratio: 1.8 (calc) (ref 1.0–2.5)
ALT: 46 U/L (ref 9–46)
AST: 27 U/L (ref 10–35)
Albumin: 4.2 g/dL (ref 3.6–5.1)
Alkaline phosphatase (APISO): 63 U/L (ref 35–144)
BUN: 15 mg/dL (ref 7–25)
CO2: 28 mmol/L (ref 20–32)
Calcium: 9.1 mg/dL (ref 8.6–10.3)
Chloride: 105 mmol/L (ref 98–110)
Creat: 0.86 mg/dL (ref 0.70–1.35)
Globulin: 2.3 g/dL (calc) (ref 1.9–3.7)
Glucose, Bld: 74 mg/dL (ref 65–99)
Potassium: 3.9 mmol/L (ref 3.5–5.3)
Sodium: 142 mmol/L (ref 135–146)
Total Bilirubin: 0.9 mg/dL (ref 0.2–1.2)
Total Protein: 6.5 g/dL (ref 6.1–8.1)
eGFR: 98 mL/min/{1.73_m2} (ref >=60)

## 2021-06-21 LAB — CBC WITH DIFFERENTIAL/PLATELET
Absolute Monocytes: 680 cells/uL (ref 200–950)
Basophils Absolute: 72 cells/uL (ref 0–200)
Basophils Relative: 0.9 %
Eosinophils Absolute: 192 cells/uL (ref 15–500)
Eosinophils Relative: 2.4 %
HCT: 44.3 % (ref 38.5–50.0)
Hemoglobin: 15.3 g/dL (ref 13.2–17.1)
Lymphs Abs: 2112 cells/uL (ref 850–3900)
MCH: 32.2 pg (ref 27.0–33.0)
MCHC: 34.5 g/dL (ref 32.0–36.0)
MCV: 93.3 fL (ref 80.0–100.0)
MPV: 9.6 fL (ref 7.5–12.5)
Monocytes Relative: 8.5 %
Neutro Abs: 4944 cells/uL (ref 1500–7800)
Neutrophils Relative %: 61.8 %
Platelets: 255 10*3/uL (ref 140–400)
RBC: 4.75 10*6/uL (ref 4.20–5.80)
RDW: 12.4 % (ref 11.0–15.0)
Total Lymphocyte: 26.4 %
WBC: 8 10*3/uL (ref 3.8–10.8)

## 2021-06-21 LAB — HEMOGLOBIN A1C
Hgb A1c MFr Bld: 5.5 % of total Hgb (ref <5.7)
Mean Plasma Glucose: 111 mg/dL
eAG (mmol/L): 6.2 mmol/L

## 2021-06-21 LAB — LIPID PANEL
Cholesterol: 176 mg/dL (ref <200)
HDL: 54 mg/dL (ref >=40)
LDL Cholesterol (Calc): 103 mg/dL (calc) — ABNORMAL HIGH
Non-HDL Cholesterol (Calc): 122 mg/dL (calc) (ref <130)
Total CHOL/HDL Ratio: 3.3 (calc) (ref <5.0)
Triglycerides: 95 mg/dL (ref <150)

## 2021-06-21 LAB — TSH: TSH: 2.07 mIU/L (ref 0.40–4.50)

## 2021-06-21 LAB — MAGNESIUM: Magnesium: 2 mg/dL (ref 1.5–2.5)

## 2021-06-21 LAB — VITAMIN D 25 HYDROXY (VIT D DEFICIENCY, FRACTURES): Vit D, 25-Hydroxy: 73 ng/mL (ref 30–100)

## 2021-06-21 LAB — INSULIN, RANDOM: Insulin: 5.6 u[IU]/mL

## 2021-06-21 NOTE — Progress Notes (Signed)
============================================================ -   Test results slightly outside the reference range are not unusual. If there is anything important, I will review this with you,  otherwise it is considered normal test values.  If you have further questions,  please do not hesitate to contact me at the office or via My Chart.  ============================================================ ============================================================  -  Total Chol = 176  -  Excellent   - Very low risk for Heart Attack  / Stroke ============================================================ ============================================================  -  A1c - Normal - No Diabetes  - Great  ! ============================================================ ============================================================  -  Vitamin D = 73 - Excellent - Please Keep dose same  ============================================================ ============================================================  -  All Else - CBC - Kidneys - Electrolytes - Liver - Magnesium & Thyroid    - all  Normal / OK ============================================================ ============================================================

## 2021-07-04 ENCOUNTER — Other Ambulatory Visit: Payer: Self-pay | Admitting: Adult Health

## 2021-12-13 ENCOUNTER — Ambulatory Visit (INDEPENDENT_AMBULATORY_CARE_PROVIDER_SITE_OTHER): Payer: BC Managed Care – PPO | Admitting: Internal Medicine

## 2021-12-13 ENCOUNTER — Encounter: Payer: Self-pay | Admitting: Internal Medicine

## 2021-12-13 VITALS — BP 120/72 | HR 51 | Temp 97.9°F | Resp 16 | Ht 69.0 in | Wt 182.2 lb

## 2021-12-13 DIAGNOSIS — Z131 Encounter for screening for diabetes mellitus: Secondary | ICD-10-CM

## 2021-12-13 DIAGNOSIS — Z130 Encounter for screening for diseases of the blood and blood-forming organs and certain disorders involving the immune mechanism: Secondary | ICD-10-CM

## 2021-12-13 DIAGNOSIS — R5383 Other fatigue: Secondary | ICD-10-CM

## 2021-12-13 DIAGNOSIS — Z0000 Encounter for general adult medical examination without abnormal findings: Secondary | ICD-10-CM

## 2021-12-13 DIAGNOSIS — N401 Enlarged prostate with lower urinary tract symptoms: Secondary | ICD-10-CM

## 2021-12-13 DIAGNOSIS — Z136 Encounter for screening for cardiovascular disorders: Secondary | ICD-10-CM | POA: Diagnosis not present

## 2021-12-13 DIAGNOSIS — R350 Frequency of micturition: Secondary | ICD-10-CM

## 2021-12-13 DIAGNOSIS — I700 Atherosclerosis of aorta: Secondary | ICD-10-CM | POA: Diagnosis not present

## 2021-12-13 DIAGNOSIS — Z0001 Encounter for general adult medical examination with abnormal findings: Secondary | ICD-10-CM

## 2021-12-13 DIAGNOSIS — Z125 Encounter for screening for malignant neoplasm of prostate: Secondary | ICD-10-CM

## 2021-12-13 DIAGNOSIS — E538 Deficiency of other specified B group vitamins: Secondary | ICD-10-CM

## 2021-12-13 DIAGNOSIS — Z1329 Encounter for screening for other suspected endocrine disorder: Secondary | ICD-10-CM

## 2021-12-13 DIAGNOSIS — R7309 Other abnormal glucose: Secondary | ICD-10-CM

## 2021-12-13 DIAGNOSIS — Z79899 Other long term (current) drug therapy: Secondary | ICD-10-CM | POA: Diagnosis not present

## 2021-12-13 DIAGNOSIS — Z13220 Encounter for screening for lipoid disorders: Secondary | ICD-10-CM | POA: Diagnosis not present

## 2021-12-13 DIAGNOSIS — E559 Vitamin D deficiency, unspecified: Secondary | ICD-10-CM | POA: Diagnosis not present

## 2021-12-13 DIAGNOSIS — Z111 Encounter for screening for respiratory tuberculosis: Secondary | ICD-10-CM | POA: Diagnosis not present

## 2021-12-13 DIAGNOSIS — Z1389 Encounter for screening for other disorder: Secondary | ICD-10-CM | POA: Diagnosis not present

## 2021-12-13 DIAGNOSIS — E782 Mixed hyperlipidemia: Secondary | ICD-10-CM

## 2021-12-13 DIAGNOSIS — Z8249 Family history of ischemic heart disease and other diseases of the circulatory system: Secondary | ICD-10-CM

## 2021-12-13 DIAGNOSIS — Z1211 Encounter for screening for malignant neoplasm of colon: Secondary | ICD-10-CM

## 2021-12-13 DIAGNOSIS — I10 Essential (primary) hypertension: Secondary | ICD-10-CM | POA: Diagnosis not present

## 2021-12-13 DIAGNOSIS — E291 Testicular hypofunction: Secondary | ICD-10-CM

## 2021-12-13 NOTE — Progress Notes (Signed)
? ?Annual  Screening/Preventative Visit  ?& Comprehensive Evaluation & Examination ? ?Future Appointments  ?Date Time Provider Department  ?12/13/2021  3:00 PM Unk Pinto, MD [facility]  ?12/19/2022  2:00 PM Unk Pinto, MD [facility]  ? ?    ?     This very nice 61 y.o. MEM presents for a Screening /Preventative Visit & comprehensive evaluation and management of multiple medical co-morbidities.  Patient has been followed for HTN, HLD, Prediabetes, Testosterone Deficiency  and Vitamin D Deficiency. ? ? ?    HTN predates since  80. Patient's BP has been controlled at home.  Today's BP is at goal - 120/72. Patient denies any cardiac symptoms as chest pain, palpitations, shortness of breath, dizziness or ankle swelling. ? ? ?    Patient's hyperlipidemia is near  controlled with diet and medications. Patient denies myalgias or other medication SE's. Last lipids were near goal : ? ?Lab Results  ?Component Value Date  ? CHOL 176 06/20/2021  ? HDL 54 06/20/2021  ? LDLCALC 103 (H) 06/20/2021  ? TRIG 95 06/20/2021  ? CHOLHDL 3.3 06/20/2021  ? ?  ?                                       Patient has hx/o Low Testosterone (2005) & had stopped replacement  last year and on Zinc supplementation  his Testosterone level returned to normal range ("568").  ? ? ?    Patient has  hx/o prediabetes (A1c 6.1% /2012) and patient denies reactive hypoglycemic symptoms, visual blurring, diabetic polys or paresthesias. Last A1c was at goal : ?  ?Lab Results  ?Component Value Date  ? HGBA1C 5.5 06/20/2021  ?  ? ? ?    Finally, patient has history of Vitamin D Deficiency ("24" /2008) and last vitamin D was at goal : ?  ?Lab Results  ?Component Value Date  ? VD25OH 73 06/20/2021  ? ? ? ?Current Outpatient Medications on File Prior to Visit  ?Medication Sig  ? aspirin 81 MG tablet Take daily.  ? atenolol 100 MG tablet TAKE 1 TABLET  EVERY DAY   ? VITAMIN D  5000 UNITS CAPS Take daily.  ? magnesium  500 MG tablet Take  daily.  ? Patient  not taking )  ? Zinc 50 MG TABS Take daily.  ? ? ? ? ?Allergies  ?Allergen Reactions  ? Viagra [Sildenafil Citrate]   ?  palpitations  ? ? ? ?Past Medical History:  ?Diagnosis Date  ? Glaucoma   ? Hyperlipemia   ? Hypertension   ? Hypogonadism male   ? Vitamin D deficiency   ? ? ? ?Health Maintenance  ?Topic Date Due  ? HIV Screening  Never done  ? Hepatitis C Screening  Never done  ? COVID-19 Vaccine (4 - Booster for Pfizer series) 11/18/2020  ? INFLUENZA VACCINE  Never done  ? TETANUS/TDAP  06/17/2021  ? Zoster Vaccines- Shingrix  Completed  ? HPV VACCINES  Aged Out  ? ? ? ?Immunization History  ?Administered Date(s) Administered  ? PFIZER SARS-COV-2 Vacc   12/13/2019, 01/03/2020, 09/23/2020  ? PPD Test 11/10/2018, 11/30/2019, 12/07/2020  ? Pneumococcal-23 10/12/1997  ? Td 06/30/2001  ? Tdap 06/18/2011  ? Zoster Recombinat (Shingrix) 01/03/2018,  05/15/2018,   ? ? ?Last Colon - 01/30/2018 - Dr Hilarie Fredrickson - Recc  5 yr  f/u due May 2024. ?  ? ?  Past Surgical History:  ?Procedure Laterality Date  ? COLONOSCOPY  01/2015  ? EYE SURGERY Right 01/08/2018  ? laser for glaucoma  ? FINGER SURGERY    ? left hand -pinky with screws  ? ORIF FINGER FRACTURE  03/27/2012  ? Procedure: OPEN REDUCTION INTERNAL FIXATION (ORIF) METACARPAL (FINGER) FRACTURE;  Surgeon: Cammie Sickle., MD;  Location: [place];  Service: Orthopedics;  Laterality: Left;  Left small proximal phalanx  ? WISDOM TOOTH EXTRACTION    ? ? ? ?Family History  ?Problem Relation Age of Onset  ? Hepatitis Father   ? Liver cancer Father   ? Diabetes Mother   ? Gallstones Mother   ? Breast cancer Sister   ? Gallstones Brother   ? Heart disease Paternal Grandfather 6  ? Colon cancer Neg Hx   ? Stomach cancer Neg Hx   ? Rectal cancer Neg Hx   ? ? ? ?Social History  ? ?Tobacco Use  ? Smoking status: Never  ? Smokeless tobacco: Never  ?Vaping Use  ? Vaping Use: Never used  ?Substance Use Topics  ? Alcohol use: Yes  ?  Alcohol/week: 6.0 standard drinks  ?   Types: 6 Standard drinks or equivalent per week  ?  Comment: 6 beer/week  ? Drug use: No  ? ? ? ? ROS ?Constitutional: Denies fever, chills, weight loss/gain, headaches, insomnia,  night sweats or change in appetite. Does c/o fatigue. ?Eyes: Denies redness, blurred vision, diplopia, discharge, itchy or watery eyes.  ?ENT: Denies discharge, congestion, post nasal drip, epistaxis, sore throat, earache, hearing loss, dental pain, Tinnitus, Vertigo, Sinus pain or snoring.  ?Cardio: Denies chest pain, palpitations, irregular heartbeat, syncope, dyspnea, diaphoresis, orthopnea, PND, claudication or edema ?Respiratory: denies cough, dyspnea, DOE, pleurisy, hoarseness, laryngitis or wheezing.  ?Gastrointestinal: Denies dysphagia, heartburn, reflux, water brash, pain, cramps, nausea, vomiting, bloating, diarrhea, constipation, hematemesis, melena, hematochezia, jaundice or hemorrhoids ?Genitourinary: Denies dysuria, frequency, urgency, nocturia, hesitancy, discharge, hematuria or flank pain ?Musculoskeletal: Denies arthralgia, myalgia, stiffness, Jt. Swelling, pain, limp or strain/sprain. Denies Falls. ?Skin: Denies puritis, rash, hives, warts, acne, eczema or change in skin lesion ?Neuro: No weakness, tremor, incoordination, spasms, paresthesia or pain ?Psychiatric: Denies confusion, memory loss or sensory loss. Denies Depression. ?Endocrine: Denies change in weight, skin, hair change, nocturia, and paresthesia, diabetic polys, visual blurring or hyper / hypo glycemic episodes.  ?Heme/Lymph: No excessive bleeding, bruising or enlarged lymph nodes. ? ? ?Physical Exam ? ?BP 120/72   Pulse (!) 51   Temp 97.9 ?F (36.6 ?C)   Resp 16   Ht '5\' 9"'$  (1.753 m)   Wt 182 lb 3.2 oz (82.6 kg)   SpO2 96%   BMI 26.91 kg/m?  ? ?General Appearance: Well nourished and well groomed and in no apparent distress. ? ?Eyes: PERRLA, EOMs, conjunctiva no swelling or erythema, normal fundi and vessels. ?Sinuses: No frontal/maxillary  tenderness ?ENT/Mouth: EACs patent / TMs  nl. Nares clear without erythema, swelling, mucoid exudates. Oral hygiene is good. No erythema, swelling, or exudate. Tongue normal, non-obstructing. Tonsils not swollen or erythematous. Hearing normal.  ?Neck: Supple, thyroid not palpable. No bruits, nodes or JVD. ?Respiratory: Respiratory effort normal.  BS equal and clear bilateral without rales, rhonci, wheezing or stridor. ?Cardio: Heart sounds are normal with regular rate and rhythm and no murmurs, rubs or gallops. Peripheral pulses are normal and equal bilaterally without edema. No aortic or femoral bruits. ?Chest: symmetric with normal excursions and percussion.  ?Abdomen: Soft, with Nl bowel  sounds. Nontender, no guarding, rebound, hernias, masses, or organomegaly.  ?Lymphatics: Non tender without lymphadenopathy.  ?Musculoskeletal: Full ROM all peripheral extremities, joint stability, 5/5 strength, and normal gait. ?Skin: Warm and dry without rashes, lesions, cyanosis, clubbing or  ecchymosis.  ?Neuro: Cranial nerves intact, reflexes equal bilaterally. Normal muscle tone, no cerebellar symptoms. Sensation intact.  ?Pysch: Alert and oriented X 3 with normal affect, insight and judgment appropriate.  ? ?Assessment and Plan ? ?1. Annual Preventative/Screening Exam  ? ?2. Essential hypertension ? ?- Microalbumin / creatinine urine ratio ?- CBC with Differential/Platelet ?- COMPLETE METABOLIC PANEL WITH GFR ?- Magnesium ?- TSH ?- EKG 12-Lead ?- [place], RETROPERITNL ABD,  LTD ?- Urinalysis, Routine w reflex microscopic ?- [place], RETROPERITNL ABD,  LTD ? ?3. Hyperlipidemia, mixed ? ?- Lipid panel ?- TSH ?- [place], RETROPERITNL ABD,  LTD ? ?4. Abnormal glucose ? ?- Hemoglobin A1c ?- Insulin, random ?- [place], RETROPERITNL ABD,  LTD ? ?5. Vitamin D deficiency ? ?- VITAMIN D 25 Hydroxy ? ?6. Testosterone Deficiency ? ?- Testosterone ? ?7. Vitamin B12 deficiency ? ?- Vitamin B12 ? ?8. Screening examination for pulmonary tuberculosis ? ?- TB  Skin Test ? ?9. Screening for colorectal cancer ? ?- POC Hemoccult Bld/Stl (3-Cd Home Screen); Future ? ?10. Prostate cancer screening ? ?- PSA ? ?11. Screening for ischemic heart disease ? ?- Microalbumin / creatinine u

## 2021-12-13 NOTE — Patient Instructions (Signed)

## 2021-12-14 LAB — VITAMIN D 25 HYDROXY (VIT D DEFICIENCY, FRACTURES): Vit D, 25-Hydroxy: 79 ng/mL (ref 30–100)

## 2021-12-14 LAB — COMPLETE METABOLIC PANEL WITH GFR
AG Ratio: 1.6 (calc) (ref 1.0–2.5)
ALT: 33 U/L (ref 9–46)
AST: 22 U/L (ref 10–35)
Albumin: 4.3 g/dL (ref 3.6–5.1)
Alkaline phosphatase (APISO): 68 U/L (ref 35–144)
BUN: 18 mg/dL (ref 7–25)
CO2: 30 mmol/L (ref 20–32)
Calcium: 9.4 mg/dL (ref 8.6–10.3)
Chloride: 101 mmol/L (ref 98–110)
Creat: 0.81 mg/dL (ref 0.70–1.35)
Globulin: 2.7 g/dL (calc) (ref 1.9–3.7)
Glucose, Bld: 79 mg/dL (ref 65–99)
Potassium: 3.9 mmol/L (ref 3.5–5.3)
Sodium: 141 mmol/L (ref 135–146)
Total Bilirubin: 1.2 mg/dL (ref 0.2–1.2)
Total Protein: 7 g/dL (ref 6.1–8.1)
eGFR: 100 mL/min/{1.73_m2} (ref >=60)

## 2021-12-14 LAB — HEMOGLOBIN A1C
Hgb A1c MFr Bld: 5.7 % of total Hgb — ABNORMAL HIGH (ref <5.7)
Mean Plasma Glucose: 117 mg/dL
eAG (mmol/L): 6.5 mmol/L

## 2021-12-14 LAB — CBC WITH DIFFERENTIAL/PLATELET
Absolute Monocytes: 710 cells/uL (ref 200–950)
Basophils Absolute: 64 cells/uL (ref 0–200)
Basophils Relative: 0.7 %
Eosinophils Absolute: 182 cells/uL (ref 15–500)
Eosinophils Relative: 2 %
HCT: 46.9 % (ref 38.5–50.0)
Hemoglobin: 15.8 g/dL (ref 13.2–17.1)
Lymphs Abs: 2712 cells/uL (ref 850–3900)
MCH: 31.8 pg (ref 27.0–33.0)
MCHC: 33.7 g/dL (ref 32.0–36.0)
MCV: 94.4 fL (ref 80.0–100.0)
MPV: 9.5 fL (ref 7.5–12.5)
Monocytes Relative: 7.8 %
Neutro Abs: 5433 cells/uL (ref 1500–7800)
Neutrophils Relative %: 59.7 %
Platelets: 258 10*3/uL (ref 140–400)
RBC: 4.97 10*6/uL (ref 4.20–5.80)
RDW: 12.1 % (ref 11.0–15.0)
Total Lymphocyte: 29.8 %
WBC: 9.1 10*3/uL (ref 3.8–10.8)

## 2021-12-14 LAB — URINALYSIS, ROUTINE W REFLEX MICROSCOPIC
Bilirubin Urine: NEGATIVE
Glucose, UA: NEGATIVE
Hgb urine dipstick: NEGATIVE
Ketones, ur: NEGATIVE
Leukocytes,Ua: NEGATIVE
Nitrite: NEGATIVE
Protein, ur: NEGATIVE
Specific Gravity, Urine: 1.003 (ref 1.001–1.035)
pH: 7 (ref 5.0–8.0)

## 2021-12-14 LAB — IRON, TOTAL/TOTAL IRON BINDING CAP
%SAT: 52 % (calc) — ABNORMAL HIGH (ref 20–48)
Iron: 156 ug/dL (ref 50–180)
TIBC: 300 mcg/dL (calc) (ref 250–425)

## 2021-12-14 LAB — LIPID PANEL
Cholesterol: 201 mg/dL — ABNORMAL HIGH (ref <200)
HDL: 63 mg/dL (ref >=40)
LDL Cholesterol (Calc): 116 mg/dL (calc) — ABNORMAL HIGH
Non-HDL Cholesterol (Calc): 138 mg/dL (calc) — ABNORMAL HIGH (ref <130)
Total CHOL/HDL Ratio: 3.2 (calc) (ref <5.0)
Triglycerides: 116 mg/dL (ref <150)

## 2021-12-14 LAB — INSULIN, RANDOM: Insulin: 5.7 u[IU]/mL

## 2021-12-14 LAB — TSH: TSH: 2.76 mIU/L (ref 0.40–4.50)

## 2021-12-14 LAB — PSA: PSA: 1.47 ng/mL (ref <=4.00)

## 2021-12-14 LAB — MAGNESIUM: Magnesium: 1.9 mg/dL (ref 1.5–2.5)

## 2021-12-14 LAB — MICROALBUMIN / CREATININE URINE RATIO
Creatinine, Urine: 9 mg/dL — ABNORMAL LOW (ref 20–320)
Microalb, Ur: <0.2 mg/dL

## 2021-12-14 LAB — VITAMIN B12: Vitamin B-12: 407 pg/mL (ref 200–1100)

## 2021-12-14 LAB — TESTOSTERONE: Testosterone: 643 ng/dL (ref 250–827)

## 2021-12-14 NOTE — Progress Notes (Signed)
<><><><><><><><><><><><><><><><><><><><><><><><><><><><><><><><><> ?<><><><><><><><><><><><><><><><><><><><><><><><><><><><><><><><><> ?- Test results slightly outside the reference range are not unusual. ?If there is anything important, I will review this with you,  ?otherwise it is considered normal test values.  ?If you have further questions,  ?please do not hesitate to contact me at the office or via My Chart.  ?<><><><><><><><><><><><><><><><><><><><><><><><><><><><><><><><><> ?<><><><><><><><><><><><><><><><><><><><><><><><><><><><><><><><><> ? ?-   Iron level - OK  ?<><><><><><><><><><><><><><><><><><><><><><><><><><><><><><><><><> ?<><><><><><><><><><><><><><><><><><><><><><><><><><><><><><><><><> ? ? ?-  Vitamin B12 =  407   is   Very Low  ?(Ideal or Goal Vit B12 is between 450 - 1,100)  ? ?Low Vit B12 may be associated with Anemia , Fatigue,  ? ?Peripheral Neuropathy,  Dementia,  Psychosis,  ? ?Impotence,  "Brain Fog"   & Depression ?<><><><><><><><><><><><><><><><><><><><><><><><><><><><><><><><><> ?- Recommend take a sub-lingual form of Vitamin B12 tablet  ? ?1,000 to 5,000 mcg tab that you dissolve under your tongue /Daily  ? ?- Can get [place]ane - best price at [employer] or on [employer] ? <><><><><><><><><><><><><><><><><><><><><><><><><><><><><><><><><> ?<><><><><><><><><><><><><><><><><><><><><><><><><><><><><><><><><> ? ?-  PSA - Low - Great  ! ?<><><><><><><><><><><><><><><><><><><><><><><><><><><><><><><><><> ?<><><><><><><><><><><><><><><><><><><><><><><><><><><><><><><><><> ? ?-  Testosterone  - Normal ?<><><><><><><><><><><><><><><><><><><><><><><><><><><><><><><><><> ?<><><><><><><><><><><><><><><><><><><><><><><><><><><><><><><><><> ? ?-  Vitamin D = 79 - Excellent  !     Please keep dose same  ?<><><><><><><><><><><><><><><><><><><><><><><><><><><><><><><><><> ?<><><><><><><><><><><><><><><><><><><><><><><><><><><><><><><><><> ? ?-  Total  Chol =   201     - Elevated  ?           (  Ideal   or  Goal is less than 180  !  )  ? ?- and  ? ?-  Bad / Dangerous LDL  Chol =  116   - also Elevated  ?            (  Ideal  or  Goal is less than 70  !  )  ?<><><><><><><><><><><><><><><><><><><><><><><><><><><><><><><><><> ? ?- Cholesterol is too high - Recommend a stricter  low cholesterol diet  ? ?- Cholesterol only comes from animal sources  ?- ie. meat, dairy, egg yolks ? ?- Eat all the vegetables you want. ? ?- Avoid meat, especially red meat - Beef AND Pork . ? ?- Avoid cheese & dairy - milk & ice cream.    ? ?- Cheese is the most concentrated form of trans-fats which  ?is the worst thing to clog up our arteries.  ? ?- Veggie cheese is OK which can be found in the fresh  ?produce section at [employer] or [employer] or [employer] ?<><><><><><><><><><><><><><><><><><><><><><><><><><><><><><><><><> ?<><><><><><><><><><><><><><><><><><><><><><><><><><><><><><><><><> ? ?-  A1c = 5.7% Blood sugar and A1c are Now  elevated in the borderline and  ?early or pre-diabetes range which has the same  ? ?300% increased risk for heart attack, stroke, cancer and  ? ?alzheimer- type vascular dementia as full blown diabetes.  ? ?But the good news is that diet, exercise with  ?weight loss can cure the early diabetes at this point. ?<><><><><><><><><><><><><><><><><><><><><><><><><><><><><><><><><> ? ?Being diabetic has a  300% increased risk for heart attack,  ?stroke, cancer, and alzheimer- type vascular dementia.  ? ?It is very important that you work harder with diet by  ?avoiding all foods that are white except chicken,   ?fish & calliflower. ? ?- Avoid white rice  ?(brown & wild rice is OK),  ? ?- Avoid white potatoes  ?(sweet potatoes in moderation is OK),  ? ?White bread or wheat bread or anything made out of  ? ?white flour like bagels, donuts, rolls, buns, biscuits, cakes, ? ?- pastries, cookies, pizza crust, and pasta (made from  ?white flour & egg whites)  ? ?-  vegetarian pasta or spinach or wheat pasta is OK. ? ?-  Multigrain breads like Arnold's, [employer] or  ? ?multigrain sandwich thins or high fiber breads like  ? ?E[place]ka bread or "Dave's Killer" breads that are  ?4 to 5 grams fiber per slice !  are best.   ? ?Diet, exercise and weight loss can reverse and cure  ?diabetes in the early stages.   ? ?- Diet, exercise and weight loss is very important in the  ? ?control and prevention of complications of diabetes which ? affects every system in your body, ie.  ? ?-Brain - dementia/stroke,  ?- eyes - glaucoma/blindness,  ?- heart - heart attack/heart failure,  ?- kidneys - dialysis,  ?- stomach - gastric paralysis,  ?- intestines - malabsorption,  ?- nerves - severe painful neuritis,  ?- circulation - gangrene & loss of a leg(s)  ?- and finally  . . . . . . . . . . . . . . . . . .   ? ?- cancer and Alzheimers. ?<><><><><><><><><><><><><><><><><><><><><><><><><><><><><><><><><> ?<><><><><><><><><><><><><><><><><><><><><><><><><><><><><><><><><> ? ?-  All Else - CBC - Kidneys - Electrolytes - Liver - Magnesium & Thyroid   ? ?- all  Normal / OK ?<><><><><><><><><><><><><><><><><><><><><><><><><><><><><><><><><> ?<><><><><><><><><><><><><><><><><><><><><><><><><><><><><><><><><> ? ? ? ? ? ? ? ? ? ? ? ? ? ? ? ? ? ? ? ? ? ? ? ? ? ? ? ? ? ? ? ? ? ?

## 2022-06-17 ENCOUNTER — Encounter: Payer: Self-pay | Admitting: Internal Medicine

## 2022-06-17 NOTE — Patient Instructions (Signed)

## 2022-06-17 NOTE — Progress Notes (Unsigned)
Future Appointments  Date Time Provider [place]  06/20/2021  2:30 PM Unk Pinto, MD [facility] None  12/13/2021  3:00 PM Unk Pinto, MD [facility] None    History of Present Illness:       This very nice 61 y.o. [name] presents for 6 month follow up with HTN, HLD, Pre-Diabetes, Testosterone, Vitamin B12  and Vitamin D Deficiencies.        Patient is treated for HTN (1982) & BP has been controlled at home. Today's BP is at goal -  134/74. Patient has had no complaints of any cardiac type chest pain, palpitations, dyspnea Vertell Limber /PND, dizziness, claudication  or dependent edema.       Hyperlipidemia is controlled with diet & meds. Patient denies myalgias or other med SE's. Last Lipids were not at goal:  Lab Results  Component Value Date   CHOL 201 (H) 12/13/2021   HDL 63 12/13/2021   LDLCALC 116 (H) 12/13/2021   TRIG 116 12/13/2021   CHOLHDL 3.2 12/13/2021    Also, the patient has is proactively monitored for glucose intolerance  and has had no symptoms of reactive hypoglycemia, diabetic polys, paresthesias or visual blurring.  Last A1c was not at goal:  Lab Results  Component Value Date   HGBA1C 5.7 (H) 12/13/2021                                                     Further, the patient also has history of Vitamin D Deficiency ("24" /2008) and supplements vitamin D without any suspected side-effects. Last vitamin D was at goal:  Lab Results  Component Value Date   VD25OH 79 12/13/2021       Current Outpatient Medications on File Prior to Visit  Medication Sig   aspirin 81 MG tablet Take  daily.   atenolol 100 MG tablet TAKE 1 TABLET  EVERY DAY   VITAMIN D 5000 UNITS CAPS Take daily.   magnesium  500 MG tablet Take  daily.   (Patient not taking)   Zinc 50 MG TABS Take  daily.     Allergies  Allergen Reactions   Viagra [Sildenafil Citrate] palpitations    PMHx:   Past Medical History:  Diagnosis Date   Glaucoma    Hyperlipemia     Hypertension    Hypogonadism male    Vitamin D deficiency      Immunization History  Administered Date(s) Administered   PFIZER SARS-COV-2 Vacc 12/13/2019, 01/03/2020, 09/23/2020   PPD Test 11/10/2018, 11/30/2019, 12/07/2020   Pneumococcal-23 10/12/1997   Td 06/30/2001   Tdap 06/18/2011   Zoster Recombinat (Shingrix) 01/03/2018, 05/15/2018,     Past Surgical History:  Procedure Laterality Date   COLONOSCOPY  01/2015   EYE SURGERY Right 01/08/2018   laser for glaucoma   FINGER SURGERY     left hand -pinky with screws   ORIF FINGER FRACTURE  03/27/2012   Procedure: OPEN REDUCTION INTERNAL FIXATION (ORIF) METACARPAL (FINGER) FRACTURE;  Surgeon: Cammie Sickle., MD;  Location: [facility];  Service: Orthopedics;  Laterality: Left;  Left small proximal phalanx   WISDOM TOOTH EXTRACTION      FHx:    Reviewed / unchanged  SHx:    Reviewed / unchanged   Systems Review:  Constitutional: Denies fever, chills, wt changes, headaches, insomnia, fatigue, night  sweats, change in appetite. Eyes: Denies redness, blurred vision, diplopia, discharge, itchy, watery eyes.  ENT: Denies discharge, congestion, post nasal drip, epistaxis, sore throat, earache, hearing loss, dental pain, tinnitus, vertigo, sinus pain, snoring.  CV: Denies chest pain, palpitations, irregular heartbeat, syncope, dyspnea, diaphoresis, orthopnea, PND, claudication or edema. Respiratory: denies cough, dyspnea, DOE, pleurisy, hoarseness, laryngitis, wheezing.  Gastrointestinal: Denies dysphagia, odynophagia, heartburn, reflux, water brash, abdominal pain or cramps, nausea, vomiting, bloating, diarrhea, constipation, hematemesis, melena, hematochezia  or hemorrhoids. Genitourinary: Denies dysuria, frequency, urgency, nocturia, hesitancy, discharge, hematuria or flank pain. Musculoskeletal: Denies arthralgias, myalgias, stiffness, jt. swelling, pain, limping or strain/sprain.  Skin: Denies pruritus, rash,  hives, warts, acne, eczema or change in skin lesion(s). Neuro: No weakness, tremor, incoordination, spasms, paresthesia or pain. Psychiatric: Denies confusion, memory loss or sensory loss. Endo: Denies change in weight, skin or hair change.  Heme/Lymph: No excessive bleeding, bruising or enlarged lymph nodes.  Physical Exam  There were no vitals taken for this visit.  Appears  well nourished, well groomed  and in no distress.  Eyes: PERRLA, EOMs, conjunctiva no swelling or erythema. Sinuses: No frontal/maxillary tenderness ENT/Mouth: EAC's clear, TM's nl w/o erythema, bulging. Nares clear w/o erythema, swelling, exudates. Oropharynx clear without erythema or exudates. Oral hygiene is good. Tongue normal, non obstructing. Hearing intact.  Neck: Supple. Thyroid not palpable. Car 2+/2+ without bruits, nodes or JVD. Chest: Respirations nl with BS clear & equal w/o rales, rhonchi, wheezing or stridor.  Cor: Heart sounds normal w/ regular rate and rhythm without sig. murmurs, gallops, clicks or rubs. Peripheral pulses normal and equal  without edema.  Abdomen: Soft & bowel sounds normal. Non-tender w/o guarding, rebound, hernias, masses or organomegaly.  Lymphatics: Unremarkable.  Musculoskeletal: Full ROM all peripheral extremities, joint stability, 5/5 strength and normal gait.  Skin: Warm, dry without exposed rashes, lesions or ecchymosis apparent.  Neuro: Cranial nerves intact, reflexes equal bilaterally. Sensory-motor testing grossly intact. Tendon reflexes grossly intact.  Pysch: Alert & oriented x 3.  Insight and judgement nl & appropriate. No ideations.  Assessment and Plan:  1. Essential hypertension  - Continue medication, monitor blood pressure at home.  - Continue DASH diet.  Reminder to go to the ER if any CP,  SOB, nausea, dizziness, severe HA, changes vision/speech.   - CBC with Differential/Platelet - COMPLETE METABOLIC PANEL WITH GFR - Magnesium - TSH  2.  Hyperlipidemia, mixed  - Continue diet/meds, exercise,& lifestyle modifications.  - Continue monitor periodic cholesterol/liver & renal functions    - Lipid panel - TSH  3. Abnormal glucose  - Continue diet, exercise  - Lifestyle modifications.  - Monitor appropriate labs   - Hemoglobin A1c - Insulin, random  4. Vitamin D deficiency  - Continue supplementation.   - VITAMIN D 25 Hydroxy   5. Vitamin B12 deficiency  - Vitamin B12  6. Medication management  - CBC with Differential/Platelet - COMPLETE METABOLIC PANEL WITH GFR - Magnesium - Lipid panel - TSH - Hemoglobin A1c - Insulin, random - VITAMIN D 25 Hydroxy  - Vitamin B12         Discussed  regular exercise, BP monitoring, weight control to achieve/maintain BMI less than 25 and discussed med and SE's. Recommended labs to assess and monitor clinical status with further disposition pending results of labs.  I discussed the assessment and treatment plan with the patient. The patient was provided an opportunity to ask questions and all were answered. The patient agreed with the plan and demonstrated  an understanding of the instructions.  I provided over 30 minutes of exam, counseling, chart review and  complex critical decision making.        The patient was advised to call back or seek an in-person evaluation if the symptoms worsen or if the condition fails to improve as anticipated.   Kirtland Bouchard, MD

## 2022-06-18 ENCOUNTER — Ambulatory Visit: Payer: BC Managed Care – PPO | Admitting: Internal Medicine

## 2022-06-18 ENCOUNTER — Encounter: Payer: Self-pay | Admitting: Internal Medicine

## 2022-06-18 VITALS — BP 104/70 | HR 53 | Temp 97.9°F | Resp 16 | Ht 69.0 in | Wt 178.2 lb

## 2022-06-18 DIAGNOSIS — Z79899 Other long term (current) drug therapy: Secondary | ICD-10-CM

## 2022-06-18 DIAGNOSIS — E559 Vitamin D deficiency, unspecified: Secondary | ICD-10-CM

## 2022-06-18 DIAGNOSIS — E538 Deficiency of other specified B group vitamins: Secondary | ICD-10-CM

## 2022-06-18 DIAGNOSIS — I10 Essential (primary) hypertension: Secondary | ICD-10-CM | POA: Diagnosis not present

## 2022-06-18 DIAGNOSIS — E782 Mixed hyperlipidemia: Secondary | ICD-10-CM

## 2022-06-18 DIAGNOSIS — R7309 Other abnormal glucose: Secondary | ICD-10-CM | POA: Diagnosis not present

## 2022-06-19 LAB — CBC WITH DIFFERENTIAL/PLATELET
Absolute Monocytes: 714 cells/uL (ref 200–950)
Basophils Absolute: 69 cells/uL (ref 0–200)
Basophils Relative: 0.8 %
Eosinophils Absolute: 181 cells/uL (ref 15–500)
Eosinophils Relative: 2.1 %
HCT: 43.2 % (ref 38.5–50.0)
Hemoglobin: 14.8 g/dL (ref 13.2–17.1)
Lymphs Abs: 2322 cells/uL (ref 850–3900)
MCH: 32.1 pg (ref 27.0–33.0)
MCHC: 34.3 g/dL (ref 32.0–36.0)
MCV: 93.7 fL (ref 80.0–100.0)
MPV: 9.7 fL (ref 7.5–12.5)
Monocytes Relative: 8.3 %
Neutro Abs: 5315 cells/uL (ref 1500–7800)
Neutrophils Relative %: 61.8 %
Platelets: 220 10*3/uL (ref 140–400)
RBC: 4.61 10*6/uL (ref 4.20–5.80)
RDW: 12.2 % (ref 11.0–15.0)
Total Lymphocyte: 27 %
WBC: 8.6 10*3/uL (ref 3.8–10.8)

## 2022-06-19 LAB — COMPLETE METABOLIC PANEL WITH GFR
AG Ratio: 1.6 (calc) (ref 1.0–2.5)
ALT: 38 U/L (ref 9–46)
AST: 25 U/L (ref 10–35)
Albumin: 4.1 g/dL (ref 3.6–5.1)
Alkaline phosphatase (APISO): 65 U/L (ref 35–144)
BUN: 21 mg/dL (ref 7–25)
CO2: 28 mmol/L (ref 20–32)
Calcium: 9.1 mg/dL (ref 8.6–10.3)
Chloride: 105 mmol/L (ref 98–110)
Creat: 0.78 mg/dL (ref 0.70–1.35)
Globulin: 2.5 g/dL (calc) (ref 1.9–3.7)
Glucose, Bld: 74 mg/dL (ref 65–99)
Potassium: 3.9 mmol/L (ref 3.5–5.3)
Sodium: 142 mmol/L (ref 135–146)
Total Bilirubin: 1.1 mg/dL (ref 0.2–1.2)
Total Protein: 6.6 g/dL (ref 6.1–8.1)
eGFR: 100 mL/min/{1.73_m2} (ref >=60)

## 2022-06-19 LAB — LIPID PANEL
Cholesterol: 172 mg/dL (ref <200)
HDL: 57 mg/dL (ref >=40)
LDL Cholesterol (Calc): 97 mg/dL (calc)
Non-HDL Cholesterol (Calc): 115 mg/dL (calc) (ref <130)
Total CHOL/HDL Ratio: 3 (calc) (ref <5.0)
Triglycerides: 88 mg/dL (ref <150)

## 2022-06-19 LAB — TSH: TSH: 2.19 mIU/L (ref 0.40–4.50)

## 2022-06-19 LAB — VITAMIN D 25 HYDROXY (VIT D DEFICIENCY, FRACTURES): Vit D, 25-Hydroxy: 102 ng/mL — ABNORMAL HIGH (ref 30–100)

## 2022-06-19 LAB — HEMOGLOBIN A1C
Hgb A1c MFr Bld: 5.5 % of total Hgb (ref <5.7)
Mean Plasma Glucose: 111 mg/dL
eAG (mmol/L): 6.2 mmol/L

## 2022-06-19 LAB — MAGNESIUM: Magnesium: 1.9 mg/dL (ref 1.5–2.5)

## 2022-06-19 LAB — VITAMIN B12: Vitamin B-12: 353 pg/mL (ref 200–1100)

## 2022-06-19 LAB — INSULIN, RANDOM: Insulin: 5.3 u[IU]/mL

## 2022-06-19 NOTE — Progress Notes (Signed)
<><><><><><><><><><><><><><><><><><><><><><><><><><><><><><><><><> <><><><><><><><><><><><><><><><><><><><><><><><><><><><><><><><><> -   Test results slightly outside the reference range are not unusual. If there is anything important, I will review this with you,  otherwise it is considered normal test values.  If you have further questions,  please do not hesitate to contact me at the office or via My Chart.  <><><><><><><><><><><><><><><><><><><><><><><><><><><><><><><><><> <><><><><><><><><><><><><><><><><><><><><><><><><><><><><><><><><>  -  Vitamin D = 102  - Excellent - Please keep dose same  <><><><><><><><><><><><><><><><><><><><><><><><><><><><><><><><><>  -   -  Vitamin B12 =   312  is    Very  Very Low  (Ideal or Goal Vit B12 is between 450 - 1,100)   Low Vit B12 may be associated with Anemia , Fatigue,   Peripheral Neuropathy, Dementia, "Brain Fog", & Depression  - Recommend take a sub-lingual form of Vitamin B12 tablet   1,000 to 5,000 mcg tab that you dissolve under your tongue /Daily   - Can get [employer] - best price at [employer] or on [employer] <><><><><><><><><><><><><><><><><><><><><><><><><><><><><><><><><>  -  Lipids Much Much Better   - Total Chol down to 172         ( from 201)     & LDL Chol down to 97         ( from 116)   - Excellent  ! <><><><><><><><><><><><><><><><><><><><><><><><><><><><><><><><><>  -  A1c back down to Normal Non Diabetic range  - Great !  <><><><><><><><><><><><><><><><><><><><><><><><><><><><><><><><><>  -  All Else - CBC - Kidneys - Electrolytes - Liver - Magnesium & Thyroid    - all  Normal / OK <><><><><><><><><><><><><><><><><><><><><><><><><><><><><><><><><> <><><><><><><><><><><><><><><><><><><><><><><><><><><><><><><><><>

## 2022-12-19 ENCOUNTER — Encounter: Payer: BC Managed Care – PPO | Admitting: Internal Medicine

## 2023-01-21 ENCOUNTER — Encounter: Payer: Self-pay | Admitting: Internal Medicine

## 2023-01-21 NOTE — Patient Instructions (Signed)
Due to recent changes in healthcare laws, you may see the results of your imaging and laboratory studies on MyChart before your provider has had a chance to review them.  We understand that in some cases there may be results that are confusing or concerning to you. Not all laboratory results come back in the same time frame and the provider may be waiting for multiple results in order to interpret others.  Please give us 48 hours in order for your provider to thoroughly review all the results before contacting the office for clarification of your results.   ++++++++++++++++++++++++++++++++++++++  Vit D  & Vit C 1,000 mg   are recommended to help protect  against the Covid-19 and other Corona viruses.    Also it's recommended  to take  Zinc 50 mg  to help  protect against the Covid-19   and best place to get  is also on [employer]  and don't pay more than 6-8 cents /pill !  =============================== Coronavirus (COVID-19) Are you at risk?  Are you at risk for the Coronavirus (COVID-19)?  To be considered HIGH RISK for Coronavirus (COVID-19), you have to meet the following criteria:  Traveled to [place], [place], [place], [place] or [place]; or in the [place] to [place], [place], [place]  or [place]; and have fever, cough, and shortness of breath within the last 2 weeks of travel OR Been in close contact with a person diagnosed with COVID-19 within the last 2 weeks and have  fever, cough,and shortness of breath  IF YOU DO NOT MEET THESE CRITERIA, YOU ARE CONSIDERED LOW RISK FOR COVID-19.  What to do if you are HIGH RISK for COVID-19?  If you are having a medical emergency, call 911. Seek medical care right away. Before you go to a doctor's office, urgent care or emergency department,  call ahead and tell them about your recent travel, contact with someone diagnosed with COVID-19   and your symptoms.  You should receive instructions from your physician's office  regarding next steps of care.  When you arrive at healthcare provider, tell the healthcare staff immediately you have returned from  visiting [place], [place], [place], [place] or [place]; or traveled in the [place] to [place], [place],  [place] or [place] in the last two weeks or you have been in close contact with a person diagnosed with  COVID-19 in the last 2 weeks.   Tell the health care staff about your symptoms: fever, cough and shortness of breath. After you have been seen by a medical provider, you will be either: Tested for (COVID-19) and discharged home on quarantine except to seek medical care if  symptoms worsen, and asked to  Stay home and avoid contact with others until you get your results (4-5 days)  Avoid travel on public transportation if possible (such as bus, train, or airplane) or Sent to the Emergency Department by EMS for evaluation, COVID-19 testing  and  possible admission depending on your condition and test results.  What to do if you are LOW RISK for COVID-19?  Reduce your risk of any infection by using the same precautions used for avoiding the common cold or flu:  Wash your hands often with soap and warm water for at least 20 seconds.  If soap and water are not readily available,  use an alcohol-based hand sanitizer with at least 60% alcohol.  If coughing or sneezing, cover your mouth and nose by coughing   or sneezing into the elbow areas of your shirt or coat,  into a tissue or into your sleeve (not your hands). Avoid shaking hands with others and consider head nods or verbal greetings only. Avoid touching your eyes, nose, or mouth with unwashed hands.  Avoid close contact with people who are sick. Avoid places or events with large numbers of people in one location, like concerts or sporting events. Carefully consider travel plans you have or are making. If you are planning any travel outside or inside the [place], visit the CDC's Travelers' Health  webpage for the latest health notices. If you have some symptoms but not all symptoms, continue to monitor at home and seek medical attention  if your symptoms worsen. If you are having a medical emergency, call 911. >>>>>>>>>>>>>>>>>>>>>>>>>>>> Preventive Care for Adults  A healthy lifestyle and preventive care can promote health and wellness. Preventive health guidelines for men include the following key practices: A routine yearly physical is a good way to check with your health care provider about your health and preventative screening. It is a chance to share any concerns and updates on your health and to receive a thorough exam. Visit your dentist for a routine exam and preventative care every 6 months. Brush your teeth twice a day and floss once a day. Good oral hygiene prevents tooth decay and gum disease. The frequency of eye exams is based on your age, health, family medical history, use of contact lenses, and other factors. Follow your health care provider's recommendations for frequency of eye exams. Eat a healthy diet. Foods such as vegetables, fruits, whole grains, low-fat dairy products, and lean protein foods contain the nutrients you need without too many calories. Decrease your intake of foods high in solid fats, added sugars, and salt. Eat the right amount of calories for you. Get information about a proper diet from your health care provider, if necessary. Regular physical exercise is one of the most important things you can do for your health. Most adults should get at least 150 minutes of moderate-intensity exercise (any activity that increases your heart rate and causes you to sweat) each week. In addition, most adults need muscle-strengthening exercises on 2 or more days a week. Maintain a healthy weight. The body mass index (BMI) is a screening tool to identify possible weight problems. It provides an estimate of body fat based on height and weight. Your health care provider can  find your BMI and can help you achieve or maintain a healthy weight. For adults 20 years and older: A BMI below 18.5 is considered underweight. A BMI of 18.5 to 24.9 is normal. A BMI of 25 to 29.9 is considered overweight. A BMI of 30 and above is considered obese. Maintain normal blood lipids and cholesterol levels by exercising and minimizing your intake of saturated fat. Eat a balanced diet with plenty of fruit and vegetables. Blood tests for lipids and cholesterol should begin at age 20 and be repeated every 5 years. If your lipid or cholesterol levels are high, you are over 50, or you are at high risk for heart disease, you may need your cholesterol levels checked more frequently. Ongoing high lipid and cholesterol levels should be treated with medicines if diet and exercise are not working. If you smoke, find out from your health care provider how to quit. If you do not use tobacco, do not start. Lung cancer screening is recommended for adults aged 55-80 years who are at high risk for   developing lung cancer because of a history of smoking. A yearly low-dose CT scan of the lungs is recommended for people who have at least a 30-pack-year history of smoking and are a current smoker or have quit within the past 15 years. A pack year of smoking is smoking an average of 1 pack of cigarettes a day for 1 year (for example: 1 pack a day for 30 years or 2 packs a day for 15 years). Yearly screening should continue until the smoker has stopped smoking for at least 15 years. Yearly screening should be stopped for people who develop a health problem that would prevent them from having lung cancer treatment. If you choose to drink alcohol, do not have more than 2 drinks per day. One drink is considered to be 12 ounces (355 mL) of beer, 5 ounces (148 mL) of wine, or 1.5 ounces (44 mL) of liquor. Avoid use of street drugs. Do not share needles with anyone. Ask for help if you need support or instructions about  stopping the use of drugs. High blood pressure causes heart disease and increases the risk of stroke. Your blood pressure should be checked at least every 1-2 years. Ongoing high blood pressure should be treated with medicines, if weight loss and exercise are not effective. If you are 45-79 years old, ask your health care provider if you should take aspirin to prevent heart disease. Diabetes screening involves taking a blood sample to check your fasting blood sugar level. This should be done once every 3 years, after age 45, if you are within normal weight and without risk factors for diabetes. Testing should be considered at a younger age or be carried out more frequently if you are overweight and have at least 1 risk factor for diabetes. Colorectal cancer can be detected and often prevented. Most routine colorectal cancer screening begins at the age of 50 and continues through age 75. However, your health care provider may recommend screening at an earlier age if you have risk factors for colon cancer. On a yearly basis, your health care provider may provide home test kits to check for hidden blood in the stool. Use of a small camera at the end of a tube to directly examine the colon (sigmoidoscopy or colonoscopy) can detect the earliest forms of colorectal cancer. Talk to your health care provider about this at age 50, when routine screening begins. Direct exam of the colon should be repeated every 5-10 years through age 75, unless early forms of precancerous polyps or small growths are found.  Talk with your health care provider about prostate cancer screening. Testicular cancer screening isrecommended for adult males. Screening includes self-exam, a health care provider exam, and other screening tests. Consult with your health care provider about any symptoms you have or any concerns you have about testicular cancer. Use sunscreen. Apply sunscreen liberally and repeatedly throughout the day. You should  seek shade when your shadow is shorter than you. Protect yourself by wearing long sleeves, pants, a wide-brimmed hat, and sunglasses year round, whenever you are outdoors. Once a month, do a whole-body skin exam, using a mirror to look at the skin on your back. Tell your health care provider about new moles, moles that have irregular borders, moles that are larger than a pencil eraser, or moles that have changed in shape or color. Stay current with required vaccines (immunizations). Influenza vaccine. All adults should be immunized every year. Tetanus, diphtheria, and acellular pertussis (Td, Tdap) vaccine. An   adult who has not previously received Tdap or who does not know his vaccine status should receive 1 dose of Tdap. This initial dose should be followed by tetanus and diphtheria toxoids (Td) booster doses every 10 years. Adults with an unknown or incomplete history of completing a 3-dose immunization series with Td-containing vaccines should begin or complete a primary immunization series including a Tdap dose. Adults should receive a Td booster every 10 years. Varicella vaccine. An adult without evidence of immunity to varicella should receive 2 doses or a second dose if he has previously received 1 dose. Human papillomavirus (HPV) vaccine. Males aged 13-21 years who have not received the vaccine previously should receive the 3-dose series. Males aged 22-26 years may be immunized. Immunization is recommended through the age of 26 years for any male who has sex with males and did not get any or all doses earlier. Immunization is recommended for any person with an immunocompromised condition through the age of 26 years if he did not get any or all doses earlier. During the 3-dose series, the second dose should be obtained 4-8 weeks after the first dose. The third dose should be obtained 24 weeks after the first dose and 16 weeks after the second dose. Zoster vaccine. One dose is recommended for adults  aged 60 years or older unless certain conditions are present.  PREVNAR  - Pneumococcal 13-valent conjugate (PCV13) vaccine. When indicated, a person who is uncertain of his immunization history and has no record of immunization should receive the PCV13 vaccine. An adult aged 19 years or older who has certain medical conditions and has not been previously immunized should receive 1 dose of PCV13 vaccine. This PCV13 should be followed with a dose of pneumococcal polysaccharide (PPSV23) vaccine. The PPSV23 vaccine dose should be obtained at least 1 r more year(s) after the dose of PCV13 vaccine. An adult aged 19 years or older who has certain medical conditions and previously received 1 or more doses of PPSV23 vaccine should receive 1 dose of PCV13. The PCV13 vaccine dose should be obtained 1 or more years after the last PPSV23 vaccine dose.  PNEUMOVAX - Pneumococcal polysaccharide (PPSV23) vaccine. When PCV13 is also indicated, PCV13 should be obtained first. All adults aged 65 years and older should be immunized. An adult younger than age 65 years who has certain medical conditions should be immunized. Any person who resides in a nursing home or long-term care facility should be immunized. An adult smoker should be immunized. People with an immunocompromised condition and certain other conditions should receive both PCV13 and PPSV23 vaccines. People with human immunodeficiency virus (HIV) infection should be immunized as soon as possible after diagnosis. Immunization during chemotherapy or radiation therapy should be avoided. Routine use of PPSV23 vaccine is not recommended for American Indians, [place] Natives, or people younger than 65 years unless there are medical conditions that require PPSV23 vaccine. When indicated, people who have unknown immunization and have no record of immunization should receive PPSV23 vaccine. One-time revaccination 5 years after the first dose of PPSV23 is recommended for people  aged 19-64 years who have chronic kidney failure, nephrotic syndrome, asplenia, or immunocompromised conditions. People who received 1-2 doses of PPSV23 before age 65 years should receive another dose of PPSV23 vaccine at age 65 years or later if at least 5 years have passed since the previous dose. Doses of PPSV23 are not needed for people immunized with PPSV23 at or after age 65 years.  Hepatitis A vaccine.   Adults who wish to be protected from this disease, have certain high-risk conditions, work with hepatitis A-infected animals, work in hepatitis A research labs, or travel to or work in countries with a high rate of hepatitis A should be immunized. Adults who were previously unvaccinated and who anticipate close contact with an international adoptee during the first 60 days after arrival in the [place] from a country with a high rate of hepatitis A should be immunized.  Hepatitis B vaccine. Adults should be immunized if they wish to be protected from this disease, have certain high-risk conditions, may be exposed to blood or other infectious body fluids, are household contacts or sex partners of hepatitis B positive people, are clients or workers in certain care facilities, or travel to or work in countries with a high rate of hepatitis B.  Preventive Service / Frequency  Ages 40 to 64 Blood pressure check. Lipid and cholesterol check Lung cancer screening. / Every year if you are aged 55-80 years and have a 30-pack-year history of smoking and currently smoke or have quit within the past 15 years. Yearly screening is stopped once you have quit smoking for at least 15 years or develop a health problem that would prevent you from having lung cancer treatment. Fecal occult blood test (FOBT) of stool. / Every year beginning at age 50 and continuing until age 75. You may not have to do this test if you get a colonoscopy every 10 years. Flexible sigmoidoscopy** or colonoscopy.** / Every 5 years for  a flexible sigmoidoscopy or every 10 years for a colonoscopy beginning at age 50 and continuing until age 75. Screening for abdominal aortic aneurysm (AAA)  by ultrasound is recommended for people who have history of high blood pressure or who are current or former smokers. +++++++++++ Recommend Adult Low Dose Aspirin or  coated  Aspirin 81 mg daily  To reduce risk of Colon Cancer 40 %,  Skin Cancer 26 % ,  Malignant Melanoma 46%  and  Pancreatic cancer 60% ++++++++++++++++++++ Vitamin D goal  is between 70-100.  Please make sure that you are taking your Vitamin D as directed.  It is very important as a natural anti-inflammatory  helping hair, skin, and nails, as well as reducing stroke and heart attack risk.  It helps your bones and helps with mood. It also decreases numerous cancer risks so please take it as directed.  Low Vit D is associated with a 200-300% higher risk for CANCER  and 200-300% higher risk for HEART   ATTACK  &  STROKE.   ...................................... It is also associated with higher death rate at younger ages,  autoimmune diseases like Rheumatoid arthritis, Lupus, Multiple Sclerosis.    Also many other serious conditions, like depression, Alzheimer's Dementia, infertility, muscle aches, fatigue, fibromyalgia - just to name a few. +++++++++++++++++++++ Recommend the book "The END of DIETING" by Dr Joel Fuhrman  & the book "The END of DIABETES " by Dr Joel Fuhrman At [employer] - get book & Audio CD's    Being diabetic has a  300% increased risk for heart attack, stroke, cancer, and alzheimer- type vascular dementia. It is very important that you work harder with diet by avoiding all foods that are white. Avoid white rice (brown & wild rice is OK), white potatoes (sweetpotatoes in moderation is OK), White bread or wheat bread or anything made out of white flour like bagels, donuts, rolls, buns, biscuits, cakes, pastries, cookies, pizza crust, and pasta (made    from white flour & egg whites) - vegetarian pasta or spinach or wheat pasta is OK. Multigrain breads like Arnold's or [employer], or multigrain sandwich thins or flatbreads.  Diet, exercise and weight loss can reverse and cure diabetes in the early stages.  Diet, exercise and weight loss is very important in the control and prevention of complications of diabetes which affects every system in your body, ie. Brain - dementia/stroke, eyes - glaucoma/blindness, heart - heart attack/heart failure, kidneys - dialysis, stomach - gastric paralysis, intestines - malabsorption, nerves - severe painful neuritis, circulation - gangrene & loss of a leg(s), and finally cancer and Alzheimers.    I recommend avoid fried & greasy foods,  sweets/candy, white rice (brown or wild rice or Quinoa is OK), white potatoes (sweet potatoes are OK) - anything made from white flour - bagels, doughnuts, rolls, buns, biscuits,white and wheat breads, pizza crust and traditional pasta made of white flour & egg white(vegetarian pasta or spinach or wheat pasta is OK).  Multi-grain bread is OK - like multi-grain flat bread or sandwich thins. Avoid alcohol in excess. Exercise is also important.    Eat all the vegetables you want - avoid meat, especially red meat and dairy - especially cheese.  Cheese is the most concentrated form of trans-fats which is the worst thing to clog up our arteries. Veggie cheese is OK which can be found in the fresh produce section at [employer] or [employer] or [employer]  ++++++++++++++++++++++ DASH Eating Plan  DASH stands for "Dietary Approaches to Stop Hypertension."   The DASH eating plan is a healthy eating plan that has been shown to reduce high blood pressure (hypertension). Additional health benefits may include reducing the risk of type 2 diabetes mellitus, heart disease, and stroke. The DASH eating plan may also help with weight loss. WHAT DO I NEED TO KNOW ABOUT THE DASH EATING PLAN? For  the DASH eating plan, you will follow these general guidelines: Choose foods with a percent daily value for sodium of less than 5% (as listed on the food label). Use salt-free seasonings or herbs instead of table salt or sea salt. Check with your health care provider or pharmacist before using salt substitutes. Eat lower-sodium products, often labeled as "lower sodium" or "no salt added." Eat fresh foods. Eat more vegetables, fruits, and low-fat dairy products. Choose whole grains. Look for the word "whole" as the first word in the ingredient list. Choose fish  Limit sweets, desserts, sugars, and sugary drinks. Choose heart-healthy fats. Eat veggie cheese  Eat more home-cooked food and less restaurant, buffet, and fast food. Limit fried foods. Cook foods using methods other than frying. Limit canned vegetables. If you do use them, rinse them well to decrease the sodium. When eating at a restaurant, ask that your food be prepared with less salt, or no salt if possible.                      WHAT FOODS CAN I EAT? Read Dr Joel Fuhrman's books on The End of Dieting & The End of Diabetes  Grains Whole grain or whole wheat bread. Brown rice. Whole grain or whole wheat pasta. Quinoa, bulgur, and whole grain cereals. Low-sodium cereals. Corn or whole wheat flour tortillas. Whole grain cornbread. Whole grain crackers. Low-sodium crackers.  Vegetables Fresh or frozen vegetables (raw, steamed, roasted, or grilled). Low-sodium or reduced-sodium tomato and vegetable juices. Low-sodium or reduced-sodium tomato sauce and paste. Low-sodium or reduced-sodium canned vegetables.     Fruits All fresh, canned (in natural juice), or frozen fruits.  Protein Products  All fish and seafood.  Dried beans, peas, or lentils. Unsalted nuts and seeds. Unsalted canned beans.  Dairy Low-fat dairy products, such as skim or 1% milk, 2% or reduced-fat cheeses, low-fat ricotta or cottage cheese, or plain low-fat yogurt.  Low-sodium or reduced-sodium cheeses.  Fats and Oils Tub margarines without trans fats. Light or reduced-fat mayonnaise and salad dressings (reduced sodium). Avocado. Safflower, olive, or canola oils. Natural peanut or almond butter.  Other Unsalted popcorn and pretzels. The items listed above may not be a complete list of recommended foods or beverages. Contact your dietitian for more options.  +++++++++++++++++++  WHAT FOODS ARE NOT RECOMMENDED? Grains/ White flour or wheat flour White bread. White pasta. White rice. Refined cornbread. Bagels and croissants. Crackers that contain trans fat.  Vegetables  Creamed or fried vegetables. Vegetables in a . Regular canned vegetables. Regular canned tomato sauce and paste. Regular tomato and vegetable juices.  Fruits Dried fruits. Canned fruit in light or heavy syrup. Fruit juice.  Meat and Other Protein Products Meat in general - RED meat & White meat.  Fatty cuts of meat. Ribs, chicken wings, all processed meats as bacon, sausage, bologna, salami, fatback, hot dogs, bratwurst and packaged luncheon meats.  Dairy Whole or 2% milk, cream, half-and-half, and cream cheese. Whole-fat or sweetened yogurt. Full-fat cheeses or blue cheese. Non-dairy creamers and whipped toppings. Processed cheese, cheese spreads, or cheese curds.  Condiments Onion and garlic salt, seasoned salt, table salt, and sea salt. Canned and packaged gravies. Worcestershire sauce. Tartar sauce. Barbecue sauce. Teriyaki sauce. Soy sauce, including reduced sodium. Steak sauce. Fish sauce. Oyster sauce. Cocktail sauce. Horseradish. Ketchup and mustard. Meat flavorings and tenderizers. Bouillon cubes. Hot sauce. Tabasco sauce. Marinades. Taco seasonings. Relishes.  Fats and Oils Butter, stick margarine, lard, shortening and bacon fat. Coconut, palm kernel, or palm oils. Regular salad dressings.  Pickles and olives. Salted popcorn and pretzels.  The items listed above may not  be a complete list of foods and beverages to avoid.   

## 2023-01-21 NOTE — Progress Notes (Unsigned)
Annual  Screening/Preventative Visit  & Comprehensive Evaluation & Examination  Future Appointments  Date Time Provider Department  01/22/2023                         cpe 10:00 AM Lucky Cowboy, MD [facility]  01/28/2024                       cpe 11:00 AM Lucky Cowboy, MD [facility]              This very nice 62 y.o. [name]  with  HTN, HLD, Prediabetes, Testosterone Deficiency  and Vitamin D Deficiency  presents for a Screening /Preventative Visit & comprehensive evaluation and management of multiple medical co-morbidities.         HTN predates circa 1982. Patient's BP has been controlled and today's BP is at goal -  122/70 . Patient denies any cardiac symptoms as chest pain, palpitations, shortness of breath, dizziness or ankle swelling.       Patient's hyperlipidemia is near  controlled with diet and he had stopped taking his Rosuvastatin prior to his 06/18/2022 labs. . Patient denies myalgias or other medication SE's. Last lipids were at goal :  Lab Results  Component Value Date   CHOL 172 06/18/2022   HDL 57 06/18/2022   LDLCALC 97 06/18/2022   TRIG 88 06/18/2022   CHOLHDL 3.0 06/18/2022                                              Patient has hx/o Low Testosterone (2005) & had stopped replacement  lin 2022 and on Zinc supplementation  his Testosterone level returned to normal range ("568").        Patient has  hx/o prediabetes (A1c 6.1% /2012) and patient denies reactive hypoglycemic symptoms, visual blurring, diabetic polys or paresthesias. Last A1c was at goal :   Lab Results  Component Value Date   HGBA1C 5.5 06/18/2022         Finally, patient has history of Vitamin D Deficiency ("24" /2008) and last vitamin D was at goal :   Lab Results  Component Value Date   VD25OH 102 (H) 06/18/2022       Current Outpatient Medications on File Prior to Visit  Medication Sig   aspirin 81 MG tablet Take daily.   atenolol 100 MG tablet TAKE 1 TABLET  EVERY DAY     VITAMIN D  5000 UNITS CAPS Take daily.   magnesium  500 MG tablet Take  daily.   Patient not taking )   Zinc 50 MG TABS Take daily.     Allergies  Allergen Reactions   Viagra [Sildenafil Citrate]     palpitations     Past Medical History:  Diagnosis Date   Glaucoma    Hyperlipemia    Hypertension    Hypogonadism male    Vitamin D deficiency      Health Maintenance  Topic Date Due   HIV Screening  Never done   Hepatitis C Screening  Never done   COVID-19 Vaccine (4 - Booster for Pfizer series) 11/18/2020   INFLUENZA VACCINE  Never done   TETANUS/TDAP  06/17/2021   Zoster Vaccines- Shingrix  Completed   HPV VACCINES  Aged [employer] History  Administered Date(s)  Administered   PFIZER SARS-COV-2 Vacc   12/13/2019, 01/03/2020, 09/23/2020   PPD Test 11/10/2018, 11/30/2019, 12/07/2020   Pneumococcal-23 10/12/1997   Td 06/30/2001   Tdap 06/18/2011   Zoster Recombinat (Shingrix) 01/03/2018,  05/15/2018,     Last Colon - 01/30/2018 - Dr Rhea Belton - Recc  5 yr  f/u due May 2024.    Past Surgical History:  Procedure Laterality Date   COLONOSCOPY  01/2015   EYE SURGERY Right 01/08/2018   laser for glaucoma   FINGER SURGERY     left hand -pinky with screws   ORIF FINGER FRACTURE  03/27/2012   Procedure: OPEN REDUCTION INTERNAL FIXATION (ORIF) METACARPAL (FINGER) FRACTURE;  Surgeon: Wyn Forster., MD;  Location: [facility];  Service: Orthopedics;  Laterality: Left;  Left small proximal phalanx   WISDOM TOOTH EXTRACTION       Family History  Problem Relation Age of Onset   Hepatitis Father    Liver cancer Father    Diabetes Mother    Gallstones Mother    Breast cancer Sister    Gallstones Brother    Heart disease Paternal Grandfather 60   Colon cancer Neg Hx    Stomach cancer Neg Hx    Rectal cancer Neg Hx      Social History   Tobacco Use   Smoking status: Never   Smokeless tobacco: Never  Vaping Use   Vaping Use:  Never used  Substance Use Topics   Alcohol use: Yes    Alcohol/week: 6.0 standard drinks    Types: 6 Standard drinks or equivalent per week    Comment: 6 beer/week   Drug use: No      ROS Constitutional: Denies fever, chills, weight loss/gain, headaches, insomnia,  night sweats or change in appetite. Does c/o fatigue. Eyes: Denies redness, blurred vision, diplopia, discharge, itchy or watery eyes.  ENT: Denies discharge, congestion, post nasal drip, epistaxis, sore throat, earache, hearing loss, dental pain, Tinnitus, Vertigo, Sinus pain or snoring.  Cardio: Denies chest pain, palpitations, irregular heartbeat, syncope, dyspnea, diaphoresis, orthopnea, PND, claudication or edema Respiratory: denies cough, dyspnea, DOE, pleurisy, hoarseness, laryngitis or wheezing.  Gastrointestinal: Denies dysphagia, heartburn, reflux, water brash, pain, cramps, nausea, vomiting, bloating, diarrhea, constipation, hematemesis, melena, hematochezia, jaundice or hemorrhoids Genitourinary: Denies dysuria, frequency, urgency, nocturia, hesitancy, discharge, hematuria or flank pain Musculoskeletal: Denies arthralgia, myalgia, stiffness, Jt. Swelling, pain, limp or strain/sprain. Denies Falls. Skin: Denies puritis, rash, hives, warts, acne, eczema or change in skin lesion Neuro: No weakness, tremor, incoordination, spasms, paresthesia or pain Psychiatric: Denies confusion, memory loss or sensory loss. Denies Depression. Endocrine: Denies change in weight, skin, hair change, nocturia, and paresthesia, diabetic polys, visual blurring or hyper / hypo glycemic episodes.  Heme/Lymph: No excessive bleeding, bruising or enlarged lymph nodes.   Physical Exam  BP 122/70   Pulse (!) 53   Temp 97.9 F (36.6 C)   Resp 16   Ht 5\' 9"  (1.753 m)   Wt 180 lb 3.2 oz (81.7 kg)   SpO2 99%   BMI 26.61 kg/m   General Appearance: Well nourished and well groomed and in no apparent distress.  Eyes: PERRLA, EOMs, conjunctiva  no swelling or erythema, normal fundi and vessels. Sinuses: No frontal/maxillary tenderness ENT/Mouth: EACs patent / TMs  nl. Nares clear without erythema, swelling, mucoid exudates. Oral hygiene is good. No erythema, swelling, or exudate. Tongue normal, non-obstructing. Tonsils not swollen or erythematous. Hearing normal.  Neck: Supple, thyroid not palpable. No bruits,  nodes or JVD. Respiratory: Respiratory effort normal.  BS equal and clear bilateral without rales, rhonci, wheezing or stridor. Cardio: Heart sounds are normal with regular rate and rhythm and no murmurs, rubs or gallops. Peripheral pulses are normal and equal bilaterally without edema. No aortic or femoral bruits. Chest: symmetric with normal excursions and percussion.  Abdomen: Soft, with Nl bowel sounds. Nontender, no guarding, rebound, hernias, masses, or organomegaly.  Lymphatics: Non tender without lymphadenopathy.  Musculoskeletal: Full ROM all peripheral extremities, joint stability, 5/5 strength, and normal gait. Skin: Warm and dry without rashes, lesions, cyanosis, clubbing or  ecchymosis.  Neuro: Cranial nerves intact, reflexes equal bilaterally. Normal muscle tone, no cerebellar symptoms. Sensation intact.  Pysch: Alert and oriented X 3 with normal affect, insight and judgment appropriate.   Assessment and Plan  1. Annual Preventative/Screening Exam    2. Essential hypertension  - EKG 12-Lead - [place], RETROPERITNL ABD,  LTD - Urinalysis, Routine w reflex microscopic - Microalbumin / creatinine urine ratio - CBC with Differential/Platelet - COMPLETE METABOLIC PANEL WITH GFR - Magnesium - TSH  3. Hyperlipidemia, mixed  - EKG 12-Lead - [place], RETROPERITNL ABD,  LTD - Lipid panel - TSH  4. Abnormal glucose  - EKG 12-Lead - [place], RETROPERITNL ABD,  LTD - Hemoglobin A1c - Insulin, random  5. Vitamin D deficiency  - VITAMIN D 25 Hydroxy   6. Vitamin B12 deficiency  - Vitamin B12  7. Testosterone  Deficiency  - Testosterone  8. Screening examination for pulmonary tuberculosis  - TB Skin Test  9. Screening for colorectal cancer  - Ambulatory referral to Gastroenterology - Dr Rhea Belton      (Due 5 year f/u colon May 2024)   10. Prostate cancer screening  - PSA  11. Screening for heart disease  - EKG 12-Lead  12. FHx: heart disease  - EKG 12-Lead - [place], RETROPERITNL ABD,  LTD  13. Screening for AAA (aortic abdominal aneurysm)  - [place], RETROPERITNL ABD,  LTD  14. Fatigue  - Iron, Total/Total Iron Binding Cap - Vitamin B12 - Testosterone - CBC with Differential/Platelet - TSH  15. Medication management  - Urinalysis, Routine w reflex microscopic - Microalbumin / creatinine urine ratio - CBC with Differential/Platelet - COMPLETE METABOLIC PANEL WITH GFR - Magnesium - Lipid panel - TSH - Hemoglobin A1c - Insulin, random - VITAMIN D 25 Hydroxy           Patient was counseled in prudent diet, weight control to achieve/maintain BMI less than 25, BP monitoring, regular exercise and medications as discussed.  Discussed med effects and SE's. Routine screening labs and tests as requested with regular follow-up as recommended. Over 40 minutes of exam, counseling, chart review and high complex critical decision making was performed   Marinus Maw, MD

## 2023-01-22 ENCOUNTER — Ambulatory Visit (INDEPENDENT_AMBULATORY_CARE_PROVIDER_SITE_OTHER): Payer: BC Managed Care – PPO | Admitting: Internal Medicine

## 2023-01-22 ENCOUNTER — Encounter: Payer: Self-pay | Admitting: Internal Medicine

## 2023-01-22 VITALS — BP 122/70 | HR 53 | Temp 97.9°F | Resp 16 | Ht 69.0 in | Wt 180.2 lb

## 2023-01-22 DIAGNOSIS — Z136 Encounter for screening for cardiovascular disorders: Secondary | ICD-10-CM

## 2023-01-22 DIAGNOSIS — I700 Atherosclerosis of aorta: Secondary | ICD-10-CM | POA: Diagnosis not present

## 2023-01-22 DIAGNOSIS — Z1329 Encounter for screening for other suspected endocrine disorder: Secondary | ICD-10-CM | POA: Diagnosis not present

## 2023-01-22 DIAGNOSIS — Z79899 Other long term (current) drug therapy: Secondary | ICD-10-CM | POA: Diagnosis not present

## 2023-01-22 DIAGNOSIS — Z13220 Encounter for screening for lipoid disorders: Secondary | ICD-10-CM | POA: Diagnosis not present

## 2023-01-22 DIAGNOSIS — Z1211 Encounter for screening for malignant neoplasm of colon: Secondary | ICD-10-CM

## 2023-01-22 DIAGNOSIS — Z125 Encounter for screening for malignant neoplasm of prostate: Secondary | ICD-10-CM

## 2023-01-22 DIAGNOSIS — Z0000 Encounter for general adult medical examination without abnormal findings: Secondary | ICD-10-CM | POA: Diagnosis not present

## 2023-01-22 DIAGNOSIS — R7309 Other abnormal glucose: Secondary | ICD-10-CM

## 2023-01-22 DIAGNOSIS — E559 Vitamin D deficiency, unspecified: Secondary | ICD-10-CM

## 2023-01-22 DIAGNOSIS — Z8249 Family history of ischemic heart disease and other diseases of the circulatory system: Secondary | ICD-10-CM

## 2023-01-22 DIAGNOSIS — Z131 Encounter for screening for diabetes mellitus: Secondary | ICD-10-CM | POA: Diagnosis not present

## 2023-01-22 DIAGNOSIS — Z130 Encounter for screening for diseases of the blood and blood-forming organs and certain disorders involving the immune mechanism: Secondary | ICD-10-CM

## 2023-01-22 DIAGNOSIS — I10 Essential (primary) hypertension: Secondary | ICD-10-CM | POA: Diagnosis not present

## 2023-01-22 DIAGNOSIS — R5383 Other fatigue: Secondary | ICD-10-CM

## 2023-01-22 DIAGNOSIS — E782 Mixed hyperlipidemia: Secondary | ICD-10-CM

## 2023-01-22 DIAGNOSIS — N401 Enlarged prostate with lower urinary tract symptoms: Secondary | ICD-10-CM

## 2023-01-22 DIAGNOSIS — R350 Frequency of micturition: Secondary | ICD-10-CM | POA: Diagnosis not present

## 2023-01-22 DIAGNOSIS — Z111 Encounter for screening for respiratory tuberculosis: Secondary | ICD-10-CM | POA: Diagnosis not present

## 2023-01-22 DIAGNOSIS — Z1389 Encounter for screening for other disorder: Secondary | ICD-10-CM

## 2023-01-22 DIAGNOSIS — Z0001 Encounter for general adult medical examination with abnormal findings: Secondary | ICD-10-CM

## 2023-01-22 DIAGNOSIS — E291 Testicular hypofunction: Secondary | ICD-10-CM

## 2023-01-22 DIAGNOSIS — E538 Deficiency of other specified B group vitamins: Secondary | ICD-10-CM

## 2023-01-22 LAB — CBC WITH DIFFERENTIAL/PLATELET
Absolute Monocytes: 511 cells/uL (ref 200–950)
Basophils Relative: 0.9 %
Lymphs Abs: 2036 cells/uL (ref 850–3900)
MCH: 31.3 pg (ref 27.0–33.0)
MCV: 94.6 fL (ref 80.0–100.0)
RDW: 12.1 % (ref 11.0–15.0)
Total Lymphocyte: 29.5 %

## 2023-01-23 LAB — COMPLETE METABOLIC PANEL WITH GFR
AG Ratio: 1.7 (calc) (ref 1.0–2.5)
ALT: 27 U/L (ref 9–46)
AST: 19 U/L (ref 10–35)
Albumin: 4 g/dL (ref 3.6–5.1)
Alkaline phosphatase (APISO): 66 U/L (ref 35–144)
BUN: 15 mg/dL (ref 7–25)
CO2: 27 mmol/L (ref 20–32)
Calcium: 9.1 mg/dL (ref 8.6–10.3)
Chloride: 105 mmol/L (ref 98–110)
Creat: 0.95 mg/dL (ref 0.70–1.35)
Globulin: 2.4 g/dL (calc) (ref 1.9–3.7)
Glucose, Bld: 87 mg/dL (ref 65–99)
Potassium: 4 mmol/L (ref 3.5–5.3)
Sodium: 141 mmol/L (ref 135–146)
Total Bilirubin: 0.8 mg/dL (ref 0.2–1.2)
Total Protein: 6.4 g/dL (ref 6.1–8.1)
eGFR: 90 mL/min/{1.73_m2} (ref >=60)

## 2023-01-23 LAB — TESTOSTERONE: Testosterone: 589 ng/dL (ref 250–827)

## 2023-01-23 LAB — MICROALBUMIN / CREATININE URINE RATIO
Creatinine, Urine: 89 mg/dL (ref 20–320)
Microalb, Ur: <0.2 mg/dL

## 2023-01-23 LAB — CBC WITH DIFFERENTIAL/PLATELET
Basophils Absolute: 62 cells/uL (ref 0–200)
Eosinophils Absolute: 97 cells/uL (ref 15–500)
Eosinophils Relative: 1.4 %
HCT: 45.3 % (ref 38.5–50.0)
Hemoglobin: 15 g/dL (ref 13.2–17.1)
MCHC: 33.1 g/dL (ref 32.0–36.0)
MPV: 9.9 fL (ref 7.5–12.5)
Monocytes Relative: 7.4 %
Neutro Abs: 4195 cells/uL (ref 1500–7800)
Neutrophils Relative %: 60.8 %
Platelets: 248 10*3/uL (ref 140–400)
RBC: 4.79 10*6/uL (ref 4.20–5.80)
WBC: 6.9 10*3/uL (ref 3.8–10.8)

## 2023-01-23 LAB — URINALYSIS, ROUTINE W REFLEX MICROSCOPIC
Bilirubin Urine: NEGATIVE
Glucose, UA: NEGATIVE
Hgb urine dipstick: NEGATIVE
Ketones, ur: NEGATIVE
Leukocytes,Ua: NEGATIVE
Nitrite: NEGATIVE
Protein, ur: NEGATIVE
Specific Gravity, Urine: 1.015 (ref 1.001–1.035)
pH: 5.5 (ref 5.0–8.0)

## 2023-01-23 LAB — TSH: TSH: 2.4 mIU/L (ref 0.40–4.50)

## 2023-01-23 LAB — HEMOGLOBIN A1C
Hgb A1c MFr Bld: 5.9 % of total Hgb — ABNORMAL HIGH (ref <5.7)
Mean Plasma Glucose: 123 mg/dL
eAG (mmol/L): 6.8 mmol/L

## 2023-01-23 LAB — LIPID PANEL
Cholesterol: 169 mg/dL (ref <200)
HDL: 52 mg/dL (ref >=40)
LDL Cholesterol (Calc): 97 mg/dL (calc)
Non-HDL Cholesterol (Calc): 117 mg/dL (calc) (ref <130)
Total CHOL/HDL Ratio: 3.3 (calc) (ref <5.0)
Triglycerides: 103 mg/dL (ref <150)

## 2023-01-23 LAB — PSA: PSA: 1.54 ng/mL (ref <=4.00)

## 2023-01-23 LAB — VITAMIN D 25 HYDROXY (VIT D DEFICIENCY, FRACTURES): Vit D, 25-Hydroxy: 100 ng/mL (ref 30–100)

## 2023-01-23 LAB — VITAMIN B12: Vitamin B-12: 1503 pg/mL — ABNORMAL HIGH (ref 200–1100)

## 2023-01-23 LAB — MAGNESIUM: Magnesium: 1.8 mg/dL (ref 1.5–2.5)

## 2023-01-23 LAB — IRON, TOTAL/TOTAL IRON BINDING CAP
%SAT: 36 % (calc) (ref 20–48)
Iron: 102 ug/dL (ref 50–180)
TIBC: 284 mcg/dL (calc) (ref 250–425)

## 2023-01-23 LAB — INSULIN, RANDOM: Insulin: 9.7 u[IU]/mL

## 2023-01-23 NOTE — Progress Notes (Signed)
^<^<^<^<^<^<^<^<^<^<^<^<^<^<^<^<^<^<^<^<^<^<^<^<^<^<^<^<^<^<^<^<^<^<^<^<^ ^>^>^>^>^>^>^>^>^>^>^>>^>^>^>^>^>^>^>^>^>^>^>^>^>^>^>^>^>^>^>^>^>^>^>^>^>  -  Test results slightly outside the reference range are not unusual. If there is anything important, I will review this with you,  otherwise it is considered normal test values.  If you have further questions,  please do not hesitate to contact me at the office or via My Chart.   ^<^<^<^<^<^<^<^<^<^<^<^<^<^<^<^<^<^<^<^<^<^<^<^<^<^<^<^<^<^<^<^<^<^<^<^<^ ^>^>^>^>^>^>^>^>^>^>^>^>^>^>^>^>^>^>^>^>^>^>^>^>^>^>^>^>^>^>^>^>^>^>^>^>^  -  Vitamin B12 is very elevated , So please cut down to 2 x /week  ^<^<^<^<^<^<^<^<^<^<^<^<^<^<^<^<^<^<^<^<^<^<^<^<^<^<^<^<^<^<^<^<^<^<^<^<^ ^>^>^>^>^>^>^>^>^>^>^>^>^>^>^>^>^>^>^>^>^>^>^>^>^>^>^>^>^>^>^>^>^>^>^>^>^  - A1c = 5.9% is elevated 12 week average blood sugar, So    - Avoid Sweets, Candy & White Stuff   - White Rice, White [place], White Flour  - Breads &  Pasta  ^<^<^<^<^<^<^<^<^<^<^<^<^<^<^<^<^<^<^<^<^<^<^<^<^<^<^<^<^<^<^<^<^<^<^<^<^ ^>^>^>^>^>^>^>^>^>^>^>^>^>^>^>^>^>^>^>^>^>^>^>^>^>^>^>^>^>^>^>^>^>^>^>^>^  - Iron Levels - OK   ^<^<^<^<^<^<^<^<^<^<^<^<^<^<^<^<^<^<^<^<^<^<^<^<^<^<^<^<^<^<^<^<^<^<^<^<^ ^>^>^>^>^>^>^>^>^>^>^>^>^>^>^>^>^>^>^>^>^>^>^>^>^>^>^>^>^>^>^>^>^>^>^>^>^  - PSA - Low - No Prostate Cancer  - Great   ^<^<^<^<^<^<^<^<^<^<^<^<^<^<^<^<^<^<^<^<^<^<^<^<^<^<^<^<^<^<^<^<^<^<^<^<^ ^>^>^>^>^>^>^>^>^>^>^>^>^>^>^>^>^>^>^>^>^>^>^>^>^>^>^>^>^>^>^>^>^>^>^>^>^  - Testosterone  level  - Normal   ^<^<^<^<^<^<^<^<^<^<^<^<^<^<^<^<^<^<^<^<^<^<^<^<^<^<^<^<^<^<^<^<^<^<^<^<^ ^>^>^>^>^>^>^>^>^>^>^>^>^>^>^>^>^>^>^>^>^>^>^>^>^>^>^>^>^>^>^>^>^>^>^>^>^  -  Magnesium = 1.8   -  is   low- goal is betw 2.0 - 2.5,   - So..............Marland Kitchen  Recommend that you take Magnesium 500 mg tablet  2 x /day with Meals   - also important to eat lots of  leafy green vegetables   - spinach - Kale - collards - greens - okra  - asparagus  - broccoli - quinoa - squash - almonds   - black, red, white beans  -  peas - green beans  ^<^<^<^<^<^<^<^<^<^<^<^<^<^<^<^<^<^<^<^<^<^<^<^<^<^<^<^<^<^<^<^<^<^<^<^<^ ^>^>^>^>^>^>^>^>^>^>^>^>^>^>^>^>^>^>^>^>^>^>^>^>^>^>^>^>^>^>^>^>^>^>^>^>^  -  Chol = 169 -  Excellent   - Very low risk for Heart Attack  / Stroke  ^<^<^<^<^<^<^<^<^<^<^<^<^<^<^<^<^<^<^<^<^<^<^<^<^<^<^<^<^<^<^<^<^<^<^<^<^ ^>^>^>^>^>^>^>^>^>^>^>^>^>^>^>^>^>^>^>^>^>^>^>^>^>^>^>^>^>^>^>^>^>^>^>^>^  - Vitamin D = 100 - Great  !    Please keep dose same   ^<^<^<^<^<^<^<^<^<^<^<^<^<^<^<^<^<^<^<^<^<^<^<^<^<^<^<^<^<^<^<^<^<^<^<^<^ ^>^>^>^>^>^>^>^>^>^>^>^>^>^>^>^>^>^>^>^>^>^>^>^>^>^>^>^>^>^>^>^>^>^>^>^>^  - All Else -  Kidneys - Electrolytes - Liver - Magnesium & Thyroid    - all  Normal / OK  ^<^<^<^<^<^<^<^<^<^<^<^<^<^<^<^<^<^<^<^<^<^<^<^<^<^<^<^<^<^<^<^<^<^<^<^<^ ^>^>^>^>^>^>^>^>^>^>^>^>^>^>^>^>^>^>^>^>^>^>^>^>^>^>^>^>^>^>^>^>^>^>^>^>^

## 2023-01-24 ENCOUNTER — Encounter: Payer: Self-pay | Admitting: Internal Medicine

## 2023-03-15 ENCOUNTER — Other Ambulatory Visit: Payer: Self-pay

## 2023-03-15 ENCOUNTER — Ambulatory Visit (AMBULATORY_SURGERY_CENTER): Payer: BC Managed Care – PPO

## 2023-03-15 VITALS — Ht 70.0 in | Wt 185.0 lb

## 2023-03-15 DIAGNOSIS — Z86010 Personal history of colonic polyps: Secondary | ICD-10-CM

## 2023-03-15 MED ORDER — NA SULFATE-K SULFATE-MG SULF 17.5-3.13-1.6 GM/177ML PO SOLN
1.0000 | Freq: Once | ORAL | 0 refills | Status: AC
Start: 2023-03-15 — End: 2023-03-15

## 2023-03-15 NOTE — Progress Notes (Signed)
Denies allergies to eggs or soy products. Denies complication of anesthesia or sedation. Denies use of weight loss medication. Denies use of O2.   Patient was called for PV. I spoke with the patient briefly and he told me that he did not feel well. He states that he is having chills and a headache. He asked if his wife Jared Carr could answer questions for him. PV was conducted with his wife. She will oversee the instructions for the prep. Jared Carr states she is going to give Jared Carr a covid test when we hang up just for precaution. If patient is still having symptoms close to his procedure they will call.

## 2023-04-02 ENCOUNTER — Encounter: Payer: Self-pay | Admitting: Internal Medicine

## 2023-04-12 ENCOUNTER — Encounter: Payer: Self-pay | Admitting: Internal Medicine

## 2023-04-12 ENCOUNTER — Ambulatory Visit (AMBULATORY_SURGERY_CENTER): Payer: BC Managed Care – PPO | Admitting: Internal Medicine

## 2023-04-12 VITALS — BP 104/54 | HR 57 | Temp 97.5°F | Resp 34 | Ht 69.0 in | Wt 185.0 lb

## 2023-04-12 DIAGNOSIS — Z86010 Personal history of colonic polyps: Secondary | ICD-10-CM

## 2023-04-12 DIAGNOSIS — D123 Benign neoplasm of transverse colon: Secondary | ICD-10-CM

## 2023-04-12 DIAGNOSIS — Z09 Encounter for follow-up examination after completed treatment for conditions other than malignant neoplasm: Secondary | ICD-10-CM

## 2023-04-12 MED ORDER — SODIUM CHLORIDE 0.9 % IV SOLN
500.0000 mL | INTRAVENOUS | Status: DC
Start: 2023-04-12 — End: 2023-04-12

## 2023-04-12 NOTE — Progress Notes (Signed)
GASTROENTEROLOGY PROCEDURE H&P NOTE   Primary Care Physician: Jared Cowboy, MD    Reason for Procedure:  History of colon polyps  Plan:    Colonoscopy  Patient is appropriate for endoscopic procedure(s) in the ambulatory ([facility]) setting.  The nature of the procedure, as well as the risks, benefits, and alternatives were carefully and thoroughly reviewed with the patient. Ample time for discussion and questions allowed. The patient understood, was satisfied, and agreed to proceed.     HPI: Jared Carr is a 62 y.o. male who presents for surveillance colonoscopy.  Medical history as below.  Tolerated the prep.  No recent chest pain or shortness of breath.  No abdominal pain today.  Past Medical History:  Diagnosis Date   Glaucoma    Hyperlipemia    Hypertension    Hypogonadism male    Vitamin D deficiency     Past Surgical History:  Procedure Laterality Date   COLONOSCOPY  01/2015   EYE SURGERY Right 01/08/2018   laser for glaucoma   FINGER SURGERY     left hand -pinky with screws   ORIF FINGER FRACTURE  03/27/2012   Procedure: OPEN REDUCTION INTERNAL FIXATION (ORIF) METACARPAL (FINGER) FRACTURE;  Surgeon: Jared Carr., MD;  Location: [facility];  Service: Orthopedics;  Laterality: Left;  Left small proximal phalanx   WISDOM TOOTH EXTRACTION      Prior to Admission medications   Medication Sig Start Date End Date Taking? Authorizing Provider  aspirin 81 MG tablet Take 81 mg by mouth daily.   Yes [provider]  atenolol (TENORMIN) 100 MG tablet TAKE 1 TABLET BY MOUTH EVERY DAY FOR BLOOD PRESSURE 07/04/21  Yes Jared Dick, NP  Cholecalciferol (VITAMIN D3) 5000 UNITS CAPS Take by mouth daily.   Yes [provider]  magnesium gluconate (MAGONATE) 500 MG tablet Take 500 mg by mouth daily.   Yes [provider]  Zinc 50 MG TABS Take by mouth daily.   Yes [provider]    Current Outpatient  Medications  Medication Sig Dispense Refill   aspirin 81 MG tablet Take 81 mg by mouth daily.     atenolol (TENORMIN) 100 MG tablet TAKE 1 TABLET BY MOUTH EVERY DAY FOR BLOOD PRESSURE 90 tablet 1   Cholecalciferol (VITAMIN D3) 5000 UNITS CAPS Take by mouth daily.     magnesium gluconate (MAGONATE) 500 MG tablet Take 500 mg by mouth daily.     Zinc 50 MG TABS Take by mouth daily.     Current Facility-Administered Medications  Medication Dose Route Frequency Provider Last Rate Last Admin   0.9 %  sodium chloride infusion  500 mL Intravenous Continuous Jared Carr, Carie Caddy, MD        Allergies as of 04/12/2023 - Review Complete 04/12/2023  Allergen Reaction Noted   Viagra [sildenafil citrate]  10/04/2013    Family History  Problem Relation Age of Onset   Diabetes Mother    Gallstones Mother    Hepatitis Father    Liver cancer Father    Breast cancer Sister    Gallstones Brother    Heart disease Paternal Grandfather 26   Colon cancer Neg Hx    Stomach cancer Neg Hx    Rectal cancer Neg Hx    Esophageal cancer Neg Hx     Social History   Socioeconomic History   Marital status: Married    Spouse name: Not on file   Number of children: 2  Years of education: Not on file   Highest education level: Not on file  Occupational History   Occupation: assembly  Tobacco Use   Smoking status: Never   Smokeless tobacco: Never  Vaping Use   Vaping status: Never Used  Substance and Sexual Activity   Alcohol use: Yes    Alcohol/week: 6.0 standard drinks of alcohol    Types: 6 Standard drinks or equivalent per week    Comment: 6 beer/week   Drug use: No   Sexual activity: Yes  Other Topics Concern   Not on file  Social History Narrative   Not on file   Social Determinants of Health   Financial Resource Strain: Not on file  Food Insecurity: Not on file  Transportation Needs: Not on file  Physical Activity: Not on file  Stress: Not on file  Social Connections: Not on file   Intimate Partner Violence: Not on file    Physical Exam: Vital signs in last 24 hours: @BP  (!) 143/73   Pulse (!) 52   Temp (!) 97.5 F (36.4 C)   Resp 14   Ht 5\' 9"  (1.753 m)   Wt 185 lb (83.9 kg)   SpO2 100%   BMI 27.32 kg/m  GEN: NAD EYE: Sclerae anicteric ENT: MMM CV: Non-tachycardic Pulm: CTA b/l GI: Soft, NT/ND NEURO:  Alert & Oriented x 3   Jared Blinks, MD [facility]  04/12/2023 12:58 PM

## 2023-04-12 NOTE — Progress Notes (Signed)
Report to PACU, RN, vss, BBS= Clear.  

## 2023-04-12 NOTE — Patient Instructions (Addendum)
Resume previous diet. Continue present medications. Await pathology results. Repeat colonoscopy is recommended for surveillance. The colonoscopy date will be determined after pathology results from today's exam become available for review.  YOU HAD AN ENDOSCOPIC PROCEDURE TODAY AT THE [facility]:   Refer to the procedure report that was given to you for any specific questions about what was found during the examination.  If the procedure report does not answer your questions, please call your gastroenterologist to clarify.  If you requested that your care partner not be given the details of your procedure findings, then the procedure report has been included in a sealed envelope for you to review at your convenience later.  YOU SHOULD EXPECT: Some feelings of bloating in the abdomen. Passage of more gas than usual.  Walking can help get rid of the air that was put into your GI tract during the procedure and reduce the bloating. If you had a lower endoscopy (such as a colonoscopy or flexible sigmoidoscopy) you may notice spotting of blood in your stool or on the toilet paper. If you underwent a bowel prep for your procedure, you may not have a normal bowel movement for a few days.  Please Note:  You might notice some irritation and congestion in your nose or some drainage.  This is from the oxygen used during your procedure.  There is no need for concern and it should clear up in a day or so.  SYMPTOMS TO REPORT IMMEDIATELY:  Following lower endoscopy (colonoscopy or flexible sigmoidoscopy):  Excessive amounts of blood in the stool  Significant tenderness or worsening of abdominal pains  Swelling of the abdomen that is new, acute  Fever of 100F or higher   For urgent or emergent issues, a gastroenterologist can be reached at any hour by calling [phone]. Do not use MyChart messaging for urgent concerns.    DIET:  We do recommend a small meal at first, but then you may  proceed to your regular diet.  Drink plenty of fluids but you should avoid alcoholic beverages for 24 hours.  ACTIVITY:  You should plan to take it easy for the rest of today and you should NOT DRIVE or use heavy machinery until tomorrow (because of the sedation medicines used during the test).    FOLLOW UP: Our staff will call the number listed on your records the next business day following your procedure.  We will call around 7:15- 8:00 am to check on you and address any questions or concerns that you may have regarding the information given to you following your procedure. If we do not reach you, we will leave a message.     If any biopsies were taken you will be contacted by phone or by letter within the next 1-3 weeks.  Please call us at [phone] if you have not heard about the biopsies in 3 weeks.    SIGNATURES/CONFIDENTIALITY: You and/or your care partner have signed paperwork which will be entered into your electronic medical record.  These signatures attest to the fact that that the information above on your After Visit Summary has been reviewed and is understood.  Full responsibility of the confidentiality of this discharge information lies with you and/or your care-partner. 

## 2023-04-12 NOTE — Op Note (Signed)
[facility] Patient Name: Jared Carr Procedure Date: 04/12/2023 12:50 PM MRN: [id number] Endoscopist: Beverley Fiedler , MD, [id number] Age: 62 Referring MD:  Date of Birth: 1961-08-02 Gender: Male Account #: [id number] Procedure:                Colonoscopy Indications:              High risk colon cancer surveillance: Personal                            history of multiple adenomas, Last colonoscopy: May                            2019 (TA x 2); May 2016 (TA x 3) Medicines:                Monitored Anesthesia Care Procedure:                Pre-Anesthesia Assessment:                           - Prior to the procedure, a History and Physical                            was performed, and patient medications and                            allergies were reviewed. The patient's tolerance of                            previous anesthesia was also reviewed. The risks                            and benefits of the procedure and the sedation                            options and risks were discussed with the patient.                            All questions were answered, and informed consent                            was obtained. Prior Anticoagulants: The patient has                            taken no anticoagulant or antiplatelet agents. ASA                            Grade Assessment: II - A patient with mild systemic                            disease. After reviewing the risks and benefits,                            the patient was deemed in satisfactory condition to  undergo the procedure.                           After obtaining informed consent, the colonoscope                            was passed under direct vision. Throughout the                            procedure, the patient's blood pressure, pulse, and                            oxygen saturations were monitored continuously. The                            Olympus Scope SN: [id number] was  introduced through                            the anus and advanced to the cecum, identified by                            appendiceal orifice and ileocecal valve. The                            colonoscopy was performed without difficulty. The                            patient tolerated the procedure well. The quality                            of the bowel preparation was good. The ileocecal                            valve, appendiceal orifice, and rectum were                            photographed. Scope In: 1:02:58 PM Scope Out: 1:14:25 PM Scope Withdrawal Time: 0 hours 10 minutes 0 seconds  Total Procedure Duration: 0 hours 11 minutes 27 seconds  Findings:                 The digital rectal exam was normal.                           A 5 mm polyp was found in the proximal transverse                            colon. The polyp was sessile. The polyp was removed                            with a cold snare. Resection and retrieval were                            complete.  Multiple medium-mouthed and small-mouthed                            diverticula were found in the sigmoid colon,                            descending colon and ascending colon.                           The exam was otherwise without abnormality on                            direct and retroflexion views. Complications:            No immediate complications. Estimated Blood Loss:     Estimated blood loss: none. Impression:               - One 5 mm polyp in the proximal transverse colon,                            removed with a cold snare. Resected and retrieved.                           - Moderate diverticulosis in the sigmoid colon, in                            the descending colon and in the ascending colon.                           - The examination was otherwise normal on direct                            and retroflexion views. Recommendation:           - Patient has a contact  number available for                            emergencies. The signs and symptoms of potential                            delayed complications were discussed with the                            patient. Return to normal activities tomorrow.                            Written discharge instructions were provided to the                            patient.                           - Resume previous diet.                           - Continue present medications.                           -  Await pathology results.                           - Repeat colonoscopy is recommended for                            surveillance. The colonoscopy date will be                            determined after pathology results from today's                            exam become available for review. Beverley Fiedler, MD 04/12/2023 1:19:23 PM This report has been signed electronically.

## 2023-04-12 NOTE — Progress Notes (Signed)
Pt's states no medical or surgical changes since previsit or office visit. 

## 2023-04-15 ENCOUNTER — Telehealth: Payer: Self-pay

## 2023-04-15 NOTE — Telephone Encounter (Signed)
  Follow up Call-     04/12/2023   12:22 PM  Call back number  Post procedure Call Back phone  # [phone]  Permission to leave phone message Yes     Patient questions:  Do you have a fever, pain , or abdominal swelling? No. Pain Score  0 *  Have you tolerated food without any problems? Yes.    Have you been able to return to your normal activities? Yes.    Do you have any questions about your discharge instructions: Diet   No. Medications  No. Follow up visit  No.  Do you have questions or concerns about your Care? No.  Actions: * If pain score is 4 or above: No action needed, pain <4.

## 2023-04-17 ENCOUNTER — Encounter: Payer: Self-pay | Admitting: Internal Medicine

## 2023-04-29 ENCOUNTER — Other Ambulatory Visit: Payer: Self-pay | Admitting: Internal Medicine

## 2023-04-29 ENCOUNTER — Encounter: Payer: Self-pay | Admitting: Internal Medicine

## 2023-04-29 DIAGNOSIS — I10 Essential (primary) hypertension: Secondary | ICD-10-CM

## 2023-04-29 MED ORDER — ATENOLOL 50 MG PO TABS
ORAL_TABLET | ORAL | 3 refills | Status: AC
Start: 2023-04-29 — End: ?

## 2023-07-25 ENCOUNTER — Ambulatory Visit: Payer: BC Managed Care – PPO | Admitting: Nurse Practitioner

## 2023-07-28 ENCOUNTER — Encounter: Payer: Self-pay | Admitting: Internal Medicine

## 2023-07-28 NOTE — Patient Instructions (Signed)

## 2023-07-28 NOTE — Progress Notes (Unsigned)
Future Appointments  Date Time Provider Department  07/29/2023  4:00 PM Lucky Cowboy, MD [facility]  01/28/2024 11:00 AM Lucky Cowboy, MD [facility]    History of Present Illness:       This very nice 62 y.o. [name]  with HTN, HLD, PreDiabetes, Testosterone, Vitamin B12  and Vitamin D Deficiency  presents for 6 month follow up.         Patient is treated for HTN (1982) & BP has been controlled at home. Today's BP is at goal -                     .   Patient has had no complaints of any cardiac type chest pain, palpitations, dyspnea Jared Carr /PND, dizziness, claudication  or dependent edema.        Hyperlipidemia is controlled with diet & meds. Patient denies myalgias or other med SE's. Last Lipids were not at goal:  Lab Results  Component Value Date   CHOL 169 01/22/2023   HDL 52 01/22/2023   LDLCALC 97 01/22/2023   TRIG 103 01/22/2023   CHOLHDL 3.3 01/22/2023     Also, the patient has is proactively monitored for glucose intolerance  and has had no symptoms of reactive hypoglycemia, diabetic polys, paresthesias or visual blurring.  Last A1c was not at goal:  Lab Results  Component Value Date   HGBA1C 5.9 (H) 01/22/2023                                                     Further, the patient also has history of Vitamin D Deficiency ("24" /2008) and supplements vitamin D without any suspected side-effects. Last vitamin D was at goal:  Lab Results  Component Value Date   VD25OH 100 01/22/2023       Current Outpatient Medications  Medication Instructions   Aspirin  81 mg  Daily   Atenolol   50 MG tablet Take  1 tablet  Daily    VITAMIN D 5000 UNITS Daily   magnesium 500 mg Daily   Zinc 50 MG TABS Daily     Allergies  Allergen Reactions   Viagra [Sildenafil Citrate] palpitations    PMHx:   Past Medical History:  Diagnosis Date   Glaucoma    Hyperlipemia    Hypertension    Hypogonadism male    Vitamin D deficiency      Immunization  History  Administered Date(s) Administered   PFIZER SARS-COV19 Vacc 12/13/2019, 01/03/2020, 09/23/2020   PPD Test 11/10/2018, 11/30/2019, 12/07/2020   Pneumococcal-23 10/12/1997   Td 06/30/2001   Tdap 06/18/2011   Zoster Recombinat (Shingrix) 01/03/2018, 05/15/2018,     Past Surgical History:  Procedure Laterality Date   COLONOSCOPY  01/2015   EYE SURGERY Right 01/08/2018   laser for glaucoma   FINGER SURGERY     left hand -pinky with screws   ORIF FINGER FRACTURE  03/27/2012   Procedure: OPEN REDUCTION INTERNAL FIXATION (ORIF) METACARPAL (FINGER) FRACTURE;  Surgeon: Wyn Forster., MD;  Location: [facility];  Service: Orthopedics;  Laterality: Left;  Left small proximal phalanx   WISDOM TOOTH EXTRACTION      FHx:    Reviewed / unchanged  SHx:    Reviewed / unchanged   Systems Review:  Constitutional: Denies fever, chills,  wt changes, headaches, insomnia, fatigue, night sweats, change in appetite. Eyes: Denies redness, blurred vision, diplopia, discharge, itchy, watery eyes.  ENT: Denies discharge, congestion, post nasal drip, epistaxis, sore throat, earache, hearing loss, dental pain, tinnitus, vertigo, sinus pain, snoring.  CV: Denies chest pain, palpitations, irregular heartbeat, syncope, dyspnea, diaphoresis, orthopnea, PND, claudication or edema. Respiratory: denies cough, dyspnea, DOE, pleurisy, hoarseness, laryngitis, wheezing.  Gastrointestinal: Denies dysphagia, odynophagia, heartburn, reflux, water brash, abdominal pain or cramps, nausea, vomiting, bloating, diarrhea, constipation, hematemesis, melena, hematochezia  or hemorrhoids. Genitourinary: Denies dysuria, frequency, urgency, nocturia, hesitancy, discharge, hematuria or flank pain. Musculoskeletal: Denies arthralgias, myalgias, stiffness, jt. swelling, pain, limping or strain/sprain.  Skin: Denies pruritus, rash, hives, warts, acne, eczema or change in skin lesion(s). Neuro: No weakness,  tremor, incoordination, spasms, paresthesia or pain. Psychiatric: Denies confusion, memory loss or sensory loss. Endo: Denies change in weight, skin or hair change.  Heme/Lymph: No excessive bleeding, bruising or enlarged lymph nodes.  Physical Exam  There were no vitals taken for this visit.  Appears  well nourished, well groomed  and in no distress.  Eyes: PERRLA, EOMs, conjunctiva no swelling or erythema. Sinuses: No frontal/maxillary tenderness ENT/Mouth: EAC's clear, TM's nl w/o erythema, bulging. Nares clear w/o erythema, swelling, exudates. Oropharynx clear without erythema or exudates. Oral hygiene is good. Tongue normal, non obstructing. Hearing intact.  Neck: Supple. Thyroid not palpable. Car 2+/2+ without bruits, nodes or JVD. Chest: Respirations nl with BS clear & equal w/o rales, rhonchi, wheezing or stridor.  Cor: Heart sounds normal w/ regular rate and rhythm without sig. murmurs, gallops, clicks or rubs. Peripheral pulses normal and equal  without edema.  Abdomen: Soft & bowel sounds normal. Non-tender w/o guarding, rebound, hernias, masses or organomegaly.  Lymphatics: Unremarkable.  Musculoskeletal: Full ROM all peripheral extremities, joint stability, 5/5 strength and normal gait.  Skin: Warm, dry without exposed rashes, lesions or ecchymosis apparent.  Neuro: Cranial nerves intact, reflexes equal bilaterally. Sensory-motor testing grossly intact. Tendon reflexes grossly intact.  Pysch: Alert & oriented x 3.  Insight and judgement nl & appropriate. No ideations.  Assessment and Plan:  1. Essential hypertension  - Continue medication, monitor blood pressure at home.  - Continue DASH diet.  Reminder to go to the ER if any CP,  SOB, nausea, dizziness, severe HA, changes vision/speech.   - CBC with Differential/Platelet - COMPLETE METABOLIC PANEL WITH GFR - Magnesium - TSH  2. Hyperlipidemia, mixed  - Continue diet/meds, exercise,& lifestyle modifications.  -  Continue monitor periodic cholesterol/liver & renal functions    - Lipid panel - TSH  3. Abnormal glucose  - Continue diet, exercise  - Lifestyle modifications.  - Monitor appropriate labs   - Hemoglobin A1c - Insulin, random  4. Vitamin D deficiency  - Continue supplementation.   - VITAMIN D 25 Hydroxy   5. Vitamin B12 deficiency  - Vitamin B12  6. Medication management  - CBC with Differential/Platelet - COMPLETE METABOLIC PANEL WITH GFR - Magnesium - Lipid panel - TSH - Hemoglobin A1c - Insulin, random - VITAMIN D 25 Hydroxy  - Vitamin B12         Discussed  regular exercise, BP monitoring, weight control to achieve/maintain BMI less than 25 and discussed med and SE's. Recommended labs to assess and monitor clinical status with further disposition pending results of labs.  I discussed the assessment and treatment plan with the patient. The patient was provided an opportunity to ask questions and all were answered. The patient  agreed with the plan and demonstrated an understanding of the instructions.  I provided over 30 minutes of exam, counseling, chart review and  complex critical decision making.        The patient was advised to call back or seek an in-person evaluation if the symptoms worsen or if the condition fails to improve as anticipated.   Marinus Maw, MD

## 2023-07-29 ENCOUNTER — Ambulatory Visit: Payer: BC Managed Care – PPO | Admitting: Internal Medicine

## 2023-07-29 ENCOUNTER — Encounter: Payer: Self-pay | Admitting: Internal Medicine

## 2023-07-29 VITALS — BP 122/70 | HR 67 | Temp 97.9°F | Resp 16 | Ht 69.0 in | Wt 182.6 lb

## 2023-07-29 DIAGNOSIS — E538 Deficiency of other specified B group vitamins: Secondary | ICD-10-CM

## 2023-07-29 DIAGNOSIS — Z79899 Other long term (current) drug therapy: Secondary | ICD-10-CM

## 2023-07-29 DIAGNOSIS — E559 Vitamin D deficiency, unspecified: Secondary | ICD-10-CM | POA: Diagnosis not present

## 2023-07-29 DIAGNOSIS — I10 Essential (primary) hypertension: Secondary | ICD-10-CM

## 2023-07-29 DIAGNOSIS — R7309 Other abnormal glucose: Secondary | ICD-10-CM | POA: Diagnosis not present

## 2023-07-29 DIAGNOSIS — E782 Mixed hyperlipidemia: Secondary | ICD-10-CM | POA: Diagnosis not present

## 2023-07-30 ENCOUNTER — Other Ambulatory Visit: Payer: Self-pay | Admitting: Internal Medicine

## 2023-07-30 DIAGNOSIS — E782 Mixed hyperlipidemia: Secondary | ICD-10-CM

## 2023-07-30 LAB — MAGNESIUM: Magnesium: 2 mg/dL (ref 1.5–2.5)

## 2023-07-30 LAB — COMPLETE METABOLIC PANEL WITH GFR
AG Ratio: 1.7 (calc) (ref 1.0–2.5)
ALT: 26 U/L (ref 9–46)
AST: 20 U/L (ref 10–35)
Albumin: 4.3 g/dL (ref 3.6–5.1)
Alkaline phosphatase (APISO): 75 U/L (ref 35–144)
BUN: 16 mg/dL (ref 7–25)
CO2: 28 mmol/L (ref 20–32)
Calcium: 9 mg/dL (ref 8.6–10.3)
Chloride: 105 mmol/L (ref 98–110)
Creat: 0.91 mg/dL (ref 0.70–1.35)
Globulin: 2.5 g/dL (ref 1.9–3.7)
Glucose, Bld: 89 mg/dL (ref 65–99)
Potassium: 4 mmol/L (ref 3.5–5.3)
Sodium: 141 mmol/L (ref 135–146)
Total Bilirubin: 0.7 mg/dL (ref 0.2–1.2)
Total Protein: 6.8 g/dL (ref 6.1–8.1)
eGFR: 94 mL/min/{1.73_m2} (ref >=60)

## 2023-07-30 LAB — CBC WITH DIFFERENTIAL/PLATELET
Absolute Lymphocytes: 2466 {cells}/uL (ref 850–3900)
Absolute Monocytes: 810 {cells}/uL (ref 200–950)
Basophils Absolute: 46 {cells}/uL (ref 0–200)
Basophils Relative: 0.5 %
Eosinophils Absolute: 146 {cells}/uL (ref 15–500)
Eosinophils Relative: 1.6 %
HCT: 46.3 % (ref 38.5–50.0)
Hemoglobin: 15.5 g/dL (ref 13.2–17.1)
MCH: 31.3 pg (ref 27.0–33.0)
MCHC: 33.5 g/dL (ref 32.0–36.0)
MCV: 93.3 fL (ref 80.0–100.0)
MPV: 9.4 fL (ref 7.5–12.5)
Monocytes Relative: 8.9 %
Neutro Abs: 5633 {cells}/uL (ref 1500–7800)
Neutrophils Relative %: 61.9 %
Platelets: 259 10*3/uL (ref 140–400)
RBC: 4.96 10*6/uL (ref 4.20–5.80)
RDW: 12.4 % (ref 11.0–15.0)
Total Lymphocyte: 27.1 %
WBC: 9.1 10*3/uL (ref 3.8–10.8)

## 2023-07-30 LAB — HEMOGLOBIN A1C
Hgb A1c MFr Bld: 5.9 %{Hb} — ABNORMAL HIGH (ref <5.7)
Mean Plasma Glucose: 123 mg/dL
eAG (mmol/L): 6.8 mmol/L

## 2023-07-30 LAB — TEST AUTHORIZATION: TEST CODE:: 17306

## 2023-07-30 LAB — INSULIN, RANDOM: Insulin: 14.5 u[IU]/mL

## 2023-07-30 LAB — LIPID PANEL
Cholesterol: 201 mg/dL — ABNORMAL HIGH (ref <200)
HDL: 47 mg/dL (ref >=40)
LDL Cholesterol (Calc): 117 mg/dL — ABNORMAL HIGH
Non-HDL Cholesterol (Calc): 154 mg/dL — ABNORMAL HIGH (ref <130)
Total CHOL/HDL Ratio: 4.3 (calc) (ref <5.0)
Triglycerides: 247 mg/dL — ABNORMAL HIGH (ref <150)

## 2023-07-30 LAB — TSH: TSH: 2.85 m[IU]/L (ref 0.40–4.50)

## 2023-07-30 LAB — VITAMIN B12: Vitamin B-12: 642 pg/mL (ref 200–1100)

## 2023-07-30 MED ORDER — ROSUVASTATIN CALCIUM 10 MG PO TABS: ORAL_TABLET | ORAL | 3 refills | Status: AC

## 2023-07-30 NOTE — Progress Notes (Signed)
<>*<>*<>*<>*<>*<>*<>*<>*<>*<>*<>*<>*<>*<>*<>*<>*<>*<>*<>*<>*<>*<>*<>*<>*<> <>*<>*<>*<>*<>*<>*<>*<>*<>*<>*<>*<>*<>*<>*<>*<>*<>*<>*<>*<>*<>*<>*<>*<>*<>  -Test results slightly outside the reference range are not unusual. If there is anything important, I will review this with you,  otherwise it is considered normal test values.  If you have further questions,  please do not hesitate to contact me at the office or via My Chart.   <>*<>*<>*<>*<>*<>*<>*<>*<>*<>*<>*<>*<>*<>*<>*<>*<>*<>*<>*<>*<>*<>*<>*<>*<> <>*<>*<>*<>*<>*<>*<>*<>*<>*<>*<>*<>*<>*<>*<>*<>*<>*<>*<>*<>*<>*<>*<>*<>*<>  -  Total  Chol =   201   - Elevated             (  Ideal  or  Goal is less than 180  !  )  & -  Bad / Dangerous LDL  Chol =  117  - also Elevated              (  Ideal  or  Goal is less than 70  !  )   - Cholesterol is too high - So sending in a new Prescription to your [facility]   help lower Cholesterol & prevent Heart Attacks, Strokes,                                                                                 Dementia & Impotence !    [] [] [] [] [] [] [] [] [] [] [] [] [] [] [] [] [] [] [] [] [] [] [] [] [] [] [] [] [] [] [] [] [] [] [] [] [] [] [] [] [] [] [] [] [] [] [] [] [] [] [] [] [] [] [] [] [] [] [] [] [] [] [] [] [] [] [] [] [] []   Also , Need to schedule a 3 month follow-up visit in Mid January to recheck labs  [] [] [] [] [] [] [] [] [] [] [] [] [] [] [] [] [] [] [] [] [] [] [] [] [] [] [] [] [] [] [] [] [] [] [] [] [] [] [] [] [] [] [] [] [] [] [] [] [] [] [] [] [] [] [] [] [] [] [] [] [] [] [] [] [] [] [] [] [] []   - Also , Diet is still very Important   - Cholesterol only comes from animal sources                                                                                  - ie. meat, dairy, egg yolks  - Eat all the vegetables you want.  - Avoid Meat, Avoid Meat,  Avoid Meat                                                               - especially Red Meat - Beef AND Pork .  - Avoid cheese & dairy - milk & ice cream.     - Cheese is the most concentrated form of trans-fats which                                                                is the worst thing to clog up our arteries.    - Veggie cheese is OK which can be found in the fresh  produce section at  [employer] or [employer] or [employer]  <>*<>*<>*<>*<>*<>*<>*<>*<>*<>*<>*<>*<>*<>*<>*<>*<>*<>*<>*<>*<>*<>*<>*<>*<> <>*<>*<>*<>*<>*<>*<>*<>*<>*<>*<>*<>*<>*<>*<>*<>*<>*<>*<>*<>*<>*<>*<>*<>*<>  -  A1c = 5.9 %   ( =  3 month average blood sugar Blood sugar)  is  elevated in                                  the borderline and early or pre-diabetes range which has the same   300% increased risk for heart attack, stroke, cancer and                                                 alzheimer- type vascular dementia as full blown diabetes.   But the good news is that diet, exercise with weight loss can                                                                                  cure the early diabetes at this point.  <>*<>*<>*<>*<>*<>*<>*<>*<>*<>*<>*<>*<>*<>*<>*<>*<>*<>*<>*<>*<>*<>*<>*<>*<> <>*<>*<>*<>*<>*<>*<>*<>*<>*<>*<>*<>*<>*<>*<>*<>*<>*<>*<>*<>*<>*<>*<>*<>*<>  -  All Else - CBC - Kidneys - Electrolytes - Liver - , Vit B12  & Thyroid    - all  Normal / OK  <>*<>*<>*<>*<>*<>*<>*<>*<>*<>*<>*<>*<>*<>*<>*<>*<>*<>*<>*<>*<>*<>*<>*<>*<> <>*<>*<>*<>*<>*<>*<>*<>*<>*<>*<>*<>*<>*<>*<>*<>*<>*<>*<>*<>*<>*<>*<>*<>*<>

## 2023-09-30 ENCOUNTER — Ambulatory Visit: Payer: BC Managed Care – PPO | Admitting: Internal Medicine

## 2024-01-28 ENCOUNTER — Encounter: Payer: BC Managed Care – PPO | Admitting: Internal Medicine

## 2024-06-04 DIAGNOSIS — D485 Neoplasm of uncertain behavior of skin: Secondary | ICD-10-CM | POA: Diagnosis not present

## 2024-06-04 DIAGNOSIS — L814 Other melanin hyperpigmentation: Secondary | ICD-10-CM | POA: Diagnosis not present

## 2024-06-04 DIAGNOSIS — L821 Other seborrheic keratosis: Secondary | ICD-10-CM | POA: Diagnosis not present

## 2024-06-04 DIAGNOSIS — L570 Actinic keratosis: Secondary | ICD-10-CM | POA: Diagnosis not present

## 2024-06-04 DIAGNOSIS — D225 Melanocytic nevi of trunk: Secondary | ICD-10-CM | POA: Diagnosis not present

## 2024-06-04 DIAGNOSIS — L578 Other skin changes due to chronic exposure to nonionizing radiation: Secondary | ICD-10-CM | POA: Diagnosis not present

## 2024-07-02 DIAGNOSIS — D045 Carcinoma in situ of skin of trunk: Secondary | ICD-10-CM | POA: Diagnosis not present

## 2024-08-26 DIAGNOSIS — H5203 Hypermetropia, bilateral: Secondary | ICD-10-CM | POA: Diagnosis not present
